# Patient Record
Sex: Male | Born: 1955 | Race: White | Hispanic: No | Marital: Single | Smoking: Never smoker
Health system: Southern US, Community
[De-identification: ages and names within clinical notes are randomized; demographics above are authoritative.]

## PROBLEM LIST (undated history)

## (undated) DIAGNOSIS — F419 Anxiety disorder, unspecified: Secondary | ICD-10-CM

## (undated) DIAGNOSIS — G309 Alzheimer's disease, unspecified: Secondary | ICD-10-CM

## (undated) DIAGNOSIS — G20 Parkinson's disease: Secondary | ICD-10-CM

## (undated) DIAGNOSIS — G20A1 Parkinson's disease without dyskinesia, without mention of fluctuations: Secondary | ICD-10-CM

## (undated) DIAGNOSIS — I10 Essential (primary) hypertension: Secondary | ICD-10-CM

## (undated) DIAGNOSIS — F329 Major depressive disorder, single episode, unspecified: Secondary | ICD-10-CM

## (undated) DIAGNOSIS — F32A Depression, unspecified: Secondary | ICD-10-CM

## (undated) DIAGNOSIS — E785 Hyperlipidemia, unspecified: Secondary | ICD-10-CM

## (undated) DIAGNOSIS — G709 Myoneural disorder, unspecified: Secondary | ICD-10-CM

## (undated) DIAGNOSIS — F0280 Dementia in other diseases classified elsewhere without behavioral disturbance: Secondary | ICD-10-CM

## (undated) HISTORY — DX: Essential (primary) hypertension: I10

## (undated) HISTORY — DX: Anxiety disorder, unspecified: F41.9

## (undated) HISTORY — DX: Hyperlipidemia, unspecified: E78.5

## (undated) HISTORY — DX: Myoneural disorder, unspecified: G70.9

## (undated) HISTORY — PX: CERVICAL FUSION: SHX112

---

## 2012-08-23 ENCOUNTER — Encounter (HOSPITAL_COMMUNITY): Payer: Self-pay | Admitting: *Deleted

## 2012-08-23 ENCOUNTER — Emergency Department (HOSPITAL_COMMUNITY): Payer: Non-veteran care

## 2012-08-23 ENCOUNTER — Emergency Department (HOSPITAL_COMMUNITY)
Admission: EM | Admit: 2012-08-23 | Discharge: 2012-08-23 | Disposition: A | Discharge status: Home / Self Care | Payer: Non-veteran care | Attending: Emergency Medicine | Admitting: Emergency Medicine

## 2012-08-23 DIAGNOSIS — G20A1 Parkinson's disease without dyskinesia, without mention of fluctuations: Secondary | ICD-10-CM

## 2012-08-23 DIAGNOSIS — G20 Parkinson's disease: Secondary | ICD-10-CM

## 2012-08-23 DIAGNOSIS — Z79899 Other long term (current) drug therapy: Secondary | ICD-10-CM | POA: Insufficient documentation

## 2012-08-23 HISTORY — DX: Parkinson's disease: G20

## 2012-08-23 HISTORY — DX: Parkinson's disease without dyskinesia, without mention of fluctuations: G20.A1

## 2012-08-23 LAB — CBC
HCT: 40.1 % (ref 39.0–52.0)
MCHC: 35.2 g/dL (ref 30.0–36.0)
Platelets: 154 10*3/uL (ref 150–400)
RDW: 12 % (ref 11.5–15.5)
WBC: 5.8 10*3/uL (ref 4.0–10.5)

## 2012-08-23 LAB — BASIC METABOLIC PANEL
BUN: 19 mg/dL (ref 6–23)
Creatinine, Ser: 0.69 mg/dL (ref 0.50–1.35)
GFR calc Af Amer: >90 mL/min (ref >90)
GFR calc non Af Amer: >90 mL/min (ref >90)
Potassium: 3.4 mEq/L — ABNORMAL LOW (ref 3.5–5.1)

## 2012-08-23 LAB — MAGNESIUM: Magnesium: 2 mg/dL (ref 1.5–2.5)

## 2012-08-23 MED ORDER — HYDROMORPHONE HCL PF 1 MG/ML IJ SOLN
0.5000 mg | Freq: Once | INTRAMUSCULAR | Status: AC
  Administered 2012-08-23: 0.5 mg via INTRAVENOUS
  Filled 2012-08-23: qty 1

## 2012-08-23 MED ORDER — CARBIDOPA-LEVODOPA 25-100 MG PO TABS
0.5000 | ORAL_TABLET | Freq: Once | ORAL | Status: AC
  Administered 2012-08-23: 0.5 via ORAL
  Filled 2012-08-23: qty 0.5

## 2012-08-23 MED ORDER — ENTACAPONE 200 MG PO TABS
200.0000 mg | ORAL_TABLET | Freq: Once | ORAL | Status: AC
  Administered 2012-08-23: 200 mg via ORAL
  Filled 2012-08-23: qty 1

## 2012-08-23 MED ORDER — LACTATED RINGERS IV BOLUS (SEPSIS)
1000.0000 mL | Freq: Once | INTRAVENOUS | Status: AC
  Administered 2012-08-23: 1000 mL via INTRAVENOUS

## 2012-08-23 MED ORDER — CARBIDOPA-LEVODOPA 25-100 MG PO TABS
1.5000 | ORAL_TABLET | Freq: Once | ORAL | Status: AC
  Administered 2012-08-23: 1.5 via ORAL
  Filled 2012-08-23: qty 1.5

## 2012-08-23 MED ORDER — HYDROMORPHONE HCL PF 1 MG/ML IJ SOLN: 1.0000 mg | Freq: Once | INTRAMUSCULAR | Status: DC

## 2012-08-23 MED ORDER — CARBIDOPA-LEVODOPA 25-100 MG PO TABS
1.0000 | ORAL_TABLET | Freq: Once | ORAL | Status: AC
  Administered 2012-08-23: 1 via ORAL
  Filled 2012-08-23: qty 1

## 2012-08-23 MED ORDER — OXYCODONE-ACETAMINOPHEN 5-325 MG PO TABS
2.0000 | ORAL_TABLET | Freq: Once | ORAL | Status: AC
  Administered 2012-08-23: 2 via ORAL
  Filled 2012-08-23: qty 2

## 2012-08-23 MED ORDER — HYDROMORPHONE HCL PF 1 MG/ML IJ SOLN
1.0000 mg | Freq: Once | INTRAMUSCULAR | Status: AC
  Administered 2012-08-23: 1 mg via INTRAVENOUS
  Filled 2012-08-23: qty 1

## 2012-08-23 NOTE — Consult Note (Signed)
Reason for Consult:Parkinsons disease Referring Physician: Rulon Abide, J  CC: Freezing  History is obtained from: PAtient, son  HPI: Larry Daniel is a 56 y.o. male who recently moved from [place] due to progression of his Parkinson's disease. Over the past few months, he has had increasing freezing and pain just before his next dose. He currently takes his sinemet as follows: Take 1 & 1/2 tablets at 6 am.  Take 1 tablet at 12 pm.  Take 1/2 tablet at 2 pm.  Take 1 tablet at 6 pm.  Take 1 tablet at 10 pm.  He takes his entacapone: Take 1 tablet at 6 am.  Take 1 tablet at 10 am.  Take 1 tablet at 2 pm.  Take 1 tablet at 6 pm.  Take 1 tablet at 10 pm.  He has had continued progression, but has not changed his dosing in over a month. He has gotten to the point that he is not able to care for himself during the day and his son works. He is in significant pain and falling due to the freezing. He is getting stuck all throughout the day.   He is having hallucinations of people that he is aware are hallucinations and he is not bothered by them.    ROS: A 14 point ROS was performed and is negative except as noted in the HPI.  Past Medical History  Diagnosis Date  . Parkinson disease     Family History: No history of parkinsons  Social History: Tob: none  Exam: Current vital signs: BP 123/70  Pulse 72  Resp 20  Ht 6\' 5"  (1.956 m)  Wt 104.327 kg (230 lb)  BMI 27.27 kg/m2  SpO2 97% Vital signs in last 24 hours: Pulse Rate:  [72] 72  (11/04 1344) Resp:  [20] 20  (11/04 1344) BP: (123)/(70) 123/70 mmHg (11/04 1344) SpO2:  [97 %] 97 % (11/04 1344) Weight:  [104.327 kg (230 lb)] 104.327 kg (230 lb) (11/04 1344)  General: In bed, appears mildly uncomfortable.  CV: RRR Mental Status: Patient is awake, alert, oriented to person, place, month, year, and situation. Immediate and remote memory are intact. Cranial Nerves: II: Visual Fields are full. Pupils are equal, round, and  reactive to light.  Discs are difficult to visualize. III,IV, VI: EOMI without ptosis or diploplia.  V: Facial sensation is symmetric to temperature VII: Facial movement is symmetric, but with marked mask facies.  VIII: hearing is intact to voice X: Uvula elevates symmetrically XI: Shoulder shrug is symmetric. XII: tongue is midline without atrophy or fasciculations.  Motor: Tone is increased with cogwheeling throughout. Bulk is normal. 5/5 strength was present in all four extremities, however he did have trouble initiating movements.  Sensory: Sensation is symmetric to light touch and temperature in the arms and legs. Deep Tendon Reflexes: 2+ and symmetric in the biceps and patellae.  Cerebellar: Marked resting tremor bilaterally Gait: Did not assess alone secondary to patient safety concerns.   I have reviewed labs in epic and the results pertinent to this consultation are: CBC/BMP unremarkable  I have reviewed the images obtained:CT head - No acute changes  Impression: 56 yo M with severe symptoms of parkinsons disease who is not coping at home. I am not familiar with the [facility] and would be hesitant to add medications which he might not be able to continue as an outpatient. At this time, I feel that he needs more dopamine. A presurgical workup may be helpful as well,  but this is not something that can be done with this hospiatliazation.   Recommendations: 1) If possible to transfer patient to [place] as both he and his son wish, this would be reasonable as it is where he is to establish care.   2) If discharged with no plan to go to [place] or if admitted here I would schedule his sinemet as follows: 7 am 2 tablets 11 am 1.5 tablets  2 pm  1 tablet 5 pm 1.5 tablets 8 pm 1 tablet 11pm 1 tablet prn if having dystonia preventing sleep.  I would give a dose of entacapone with each dose of sinemet His hallucinations may get worse, but if they are not bothersome to him, then I would  not adjust his medication solely based on this.   3) He would benefit whether inpatient or out from physical therapy with the goal of training in ways to unfreeze.   Ritta Slot, MD [facility] [phone]  If 7pm- 7am, please page neurology on call at [phone].

## 2012-08-23 NOTE — ED Notes (Signed)
Per ems: pt from home, hx of Parkinson's Disease. Caregiver with patient this morning, pt in more pain than usual, states extremities felt "locked up". Muscles still stiff,could not get pain under control. States tremors r/t parkinson's disease are worse as well. Pt a+ox4. Family en route

## 2012-08-23 NOTE — ED Provider Notes (Signed)
History     CSN: [id number]  Arrival date & time 08/23/12  1332   First MD Initiated Contact with Patient 08/23/12 1439      Chief Complaint  Patient presents with  . Tremors    (Consider location/radiation/quality/duration/timing/severity/associated sxs/prior treatment) HPI Larry Daniel is a 56 y.o. male w/ PMH of Parkinson's recently moved in with his son for social reasons.  Parkinson's has been worsening and he's been getting stuck in bed and on the toilet.  UE pain in muscles started to day, also in neck. 10/10, sharp, stabbing, worse on passive flexion and extension - but this relieves the pain after awhile with massage, has also had chills and cough x2 weeks, non-productive.    Past Medical History  Diagnosis Date  . Parkinson disease     History reviewed. No pertinent past surgical history.  No family history on file.  History  Substance Use Topics  . Smoking status: Never Smoker   . Smokeless tobacco: Not on file  . Alcohol Use: No      Review of Systems At least 10pt or greater review of systems completed and are negative except where specified in the HPI.  Allergies  Review of patient's allergies indicates no known allergies.  Home Medications   Current Outpatient Rx  Name  Route  Sig  Dispense  Refill  . CARBIDOPA-LEVODOPA 25-100 MG PO TABS   Oral   Take 1 tablet by mouth.         [name] CITALOPRAM HYDROBROMIDE 20 MG PO TABS   Oral   Take 10 mg by mouth at bedtime. Take 1/2 tablet at bedtime.         . CYANOCOBALAMIN 100 MCG PO TABS   Oral   Take 100 mcg by mouth every 4 (four) hours.         [name] ENTACAPONE 200 MG PO TABS   Oral   Take 200 mg by mouth.         . OMEGA-3 FATTY ACIDS 1000 MG PO CAPS   Oral   Take 2 g by mouth daily.         [name] FOLIC ACID 1 MG PO TABS   Oral   Take 1 mg by mouth daily.         . ADULT MULTIVITAMIN W/MINERALS CH   Oral   Take 1 tablet by mouth daily.         [name] PRESCRIPTION MEDICATION     "Donaz."           BP 123/70  Pulse 72  Resp 20  Ht 6\' 5"  (1.956 m)  Wt 230 lb (104.327 kg)  BMI 27.27 kg/m2  SpO2 97%  Physical Exam  Nursing notes reviewed.  Electronic medical record reviewed. VITAL SIGNS:   Filed Vitals:   08/23/12 1344 08/23/12 2220  BP: 123/70 109/66  Pulse: 72 72  Temp:  97.9 F (36.6 C)  TempSrc:  Oral  Resp: 20 18  Height: 6\' 5"  (1.956 m)   Weight: 230 lb (104.327 kg)   SpO2: 97% 97%   CONSTITUTIONAL: Awake, oriented, appears non-toxic HENT: Atraumatic, normocephalic, oral mucosa pink and moist, airway patent. Nares patent without drainage. External ears normal. EYES: Conjunctiva clear, EOMI, PERRLA NECK: Trachea midline, non-tender, supple CARDIOVASCULAR: Normal heart rate, Normal rhythm, No murmurs, rubs, gallops PULMONARY/CHEST: Clear to auscultation, no rhonchi, wheezes, or rales. Symmetrical breath sounds. Non-tender. ABDOMINAL: Non-distended, soft, non-tender - no rebound or guarding.  BS normal. NEUROLOGIC: Rigid throughout.  Pain to passive muscle stretching of UE.  No pain in LE.  Severe bradykinesia EXTREMITIES: No clubbing, cyanosis, or edema SKIN: Warm, Dry, No erythema, No rash  ED Course  Procedures (including critical care time)  Labs Reviewed  BASIC METABOLIC PANEL - Abnormal; Notable for the following:    Potassium 3.4 (*)     All other components within normal limits  CBC  MAGNESIUM  LAB REPORT - SCANNED   Dg Chest 2 View  08/23/2012  *RADIOLOGY REPORT*  Clinical Data: Tremors, shortness of breath  CHEST - 2 VIEW  Comparison: None  Findings: Normal heart size, mediastinal contours, and pulmonary vascularity. Lungs clear. No pleural effusion or pneumothorax. Prior cervical spine fusion. Endplate spur formation mid to lower thoracic spine.  IMPRESSION: No acute abnormalities.   Original Report Authenticated By: Ulyses Southward, M.D.    Ct Head Wo Contrast  08/23/2012  *RADIOLOGY REPORT*  Clinical Data: History of Parkinson's  disease.  Increased tremors.  CT HEAD WITHOUT CONTRAST  Technique:  Contiguous axial images were obtained from the base of the skull through the vertex without contrast.  Comparison: None.  Findings: There is no midline shift, hydrocephalus, or mass effect. No acute hemorrhage or acute transcortical infarct is identified. There is mild chronic diffuse atrophy.  The bony calvarium is intact.  There is minimal mucoperiosteal thickening of bilateral ethmoid sinuses.  IMPRESSION: No focal acute intracranial abnormality identified.  Mild chronic diffuse atrophy.   Original Report Authenticated By: Sherian Rein, M.D.      1. Parkinson's disease (tremor, stiffness, slow motion, unstable posture)       MDM  Larry Daniel is a 56 y.o. male is a [facility] patient recently moved to the area with neurology follow up on Thursday - son thinks they can't wait with pain and worsening symptoms.  They did not want to go to [place] incase whe wasn't admitted.  D/W Dr. Amada Jupiter who thinks pt does need admission for medication optimization.  Labs and imaging unremarkable.  Sinamet given in ER.  Extensive amount of time spent trying to coordinate care with [facility] - extraordinarily unhelpful.  Pt and son elected to drive to [facility] for evaluation and admission.  Dr. Amada Jupiter was concerned because of a difference in formularies between civilian and [employer] system, an admission at [facility] would not be in the patient's best interest.  Pain medicine administered and pt DC d to f/u immediately at [facility].          Jones Skene, MD 08/25/12 0865

## 2013-01-04 ENCOUNTER — Inpatient Hospital Stay (HOSPITAL_COMMUNITY)
Admission: EM | Admit: 2013-01-04 | Discharge: 2013-01-10 | DRG: 470 | Disposition: A | Discharge status: Skilled Nursing Facility | Payer: Medicare Other | Attending: Family Medicine | Admitting: Family Medicine

## 2013-01-04 ENCOUNTER — Inpatient Hospital Stay (HOSPITAL_COMMUNITY): Payer: Medicare Other

## 2013-01-04 ENCOUNTER — Encounter (HOSPITAL_COMMUNITY): Payer: Self-pay | Admitting: Certified Registered"

## 2013-01-04 ENCOUNTER — Inpatient Hospital Stay (HOSPITAL_COMMUNITY): Payer: Medicare Other | Admitting: Certified Registered"

## 2013-01-04 ENCOUNTER — Emergency Department (HOSPITAL_COMMUNITY): Payer: Medicare Other

## 2013-01-04 ENCOUNTER — Encounter (HOSPITAL_COMMUNITY): Payer: Self-pay | Admitting: Emergency Medicine

## 2013-01-04 ENCOUNTER — Encounter (HOSPITAL_COMMUNITY)
Admission: EM | Disposition: A | Discharge status: Skilled Nursing Facility | Payer: Self-pay | Source: Home / Self Care | Attending: Family Medicine

## 2013-01-04 ENCOUNTER — Other Ambulatory Visit: Payer: Self-pay

## 2013-01-04 DIAGNOSIS — Z79899 Other long term (current) drug therapy: Secondary | ICD-10-CM

## 2013-01-04 DIAGNOSIS — S72001A Fracture of unspecified part of neck of right femur, initial encounter for closed fracture: Secondary | ICD-10-CM

## 2013-01-04 DIAGNOSIS — G20A1 Parkinson's disease without dyskinesia, without mention of fluctuations: Secondary | ICD-10-CM | POA: Diagnosis present

## 2013-01-04 DIAGNOSIS — G20 Parkinson's disease: Secondary | ICD-10-CM

## 2013-01-04 DIAGNOSIS — Y92009 Unspecified place in unspecified non-institutional (private) residence as the place of occurrence of the external cause: Secondary | ICD-10-CM

## 2013-01-04 DIAGNOSIS — S72009A Fracture of unspecified part of neck of unspecified femur, initial encounter for closed fracture: Principal | ICD-10-CM

## 2013-01-04 DIAGNOSIS — W010XXA Fall on same level from slipping, tripping and stumbling without subsequent striking against object, initial encounter: Secondary | ICD-10-CM | POA: Diagnosis present

## 2013-01-04 DIAGNOSIS — E871 Hypo-osmolality and hyponatremia: Secondary | ICD-10-CM | POA: Diagnosis present

## 2013-01-04 HISTORY — PX: HIP ARTHROPLASTY: SHX981

## 2013-01-04 LAB — BASIC METABOLIC PANEL
BUN: 16 mg/dL (ref 6–23)
Creatinine, Ser: 0.77 mg/dL (ref 0.50–1.35)
GFR calc Af Amer: >90 mL/min (ref >90)
GFR calc non Af Amer: >90 mL/min (ref >90)
Potassium: 3.8 mEq/L (ref 3.5–5.1)

## 2013-01-04 LAB — CBC WITH DIFFERENTIAL/PLATELET
Basophils Absolute: 0 10*3/uL (ref 0.0–0.1)
Basophils Relative: 0 % (ref 0–1)
Lymphocytes Relative: 8 % — ABNORMAL LOW (ref 12–46)
MCHC: 35 g/dL (ref 30.0–36.0)
Neutro Abs: 10.6 10*3/uL — ABNORMAL HIGH (ref 1.7–7.7)
Neutrophils Relative %: 83 % — ABNORMAL HIGH (ref 43–77)
RDW: 11.9 % (ref 11.5–15.5)
WBC: 12.8 10*3/uL — ABNORMAL HIGH (ref 4.0–10.5)

## 2013-01-04 LAB — TYPE AND SCREEN: ABO/RH(D): O POS

## 2013-01-04 LAB — SURGICAL PCR SCREEN: Staphylococcus aureus: NEGATIVE

## 2013-01-04 LAB — ABO/RH: ABO/RH(D): O POS

## 2013-01-04 SURGERY — HEMIARTHROPLASTY, HIP, DIRECT ANTERIOR APPROACH, FOR FRACTURE
Anesthesia: General | Site: Hip | Laterality: Right | Wound class: Clean

## 2013-01-04 MED ORDER — CEFAZOLIN SODIUM-DEXTROSE 2-3 GM-% IV SOLR
2.0000 g | INTRAVENOUS | Status: AC
  Administered 2013-01-04: 2 g via INTRAVENOUS
  Filled 2013-01-04: qty 50

## 2013-01-04 MED ORDER — ENOXAPARIN SODIUM 40 MG/0.4ML ~~LOC~~ SOLN: 40.0000 mg | SUBCUTANEOUS | Status: DC

## 2013-01-04 MED ORDER — ONDANSETRON HCL 4 MG/2ML IJ SOLN
INTRAMUSCULAR | Status: DC | PRN
  Administered 2013-01-04: 2 mg via INTRAVENOUS

## 2013-01-04 MED ORDER — CISATRACURIUM BESYLATE (PF) 10 MG/5ML IV SOLN
INTRAVENOUS | Status: DC | PRN
  Administered 2013-01-04: 10 mg via INTRAVENOUS

## 2013-01-04 MED ORDER — ACETAMINOPHEN 650 MG RE SUPP: 650.0000 mg | Freq: Four times a day (QID) | RECTAL | Status: DC | PRN

## 2013-01-04 MED ORDER — EPHEDRINE SULFATE 50 MG/ML IJ SOLN
INTRAMUSCULAR | Status: DC | PRN
  Administered 2013-01-04: 10 mg via INTRAVENOUS

## 2013-01-04 MED ORDER — FENTANYL CITRATE 0.05 MG/ML IJ SOLN
INTRAMUSCULAR | Status: DC | PRN
  Administered 2013-01-04: 50 ug via INTRAVENOUS
  Administered 2013-01-04: 100 ug via INTRAVENOUS
  Administered 2013-01-04: 50 ug via INTRAVENOUS
  Administered 2013-01-04 (×2): 25 ug via INTRAVENOUS

## 2013-01-04 MED ORDER — LIDOCAINE HCL (CARDIAC) 20 MG/ML IV SOLN
INTRAVENOUS | Status: DC | PRN
  Administered 2013-01-04: 30 mg via INTRAVENOUS

## 2013-01-04 MED ORDER — SODIUM CHLORIDE 0.9 % IV SOLN: INTRAVENOUS | Status: DC

## 2013-01-04 MED ORDER — PHENOL 1.4 % MT LIQD
1.0000 | OROMUCOSAL | Status: DC | PRN
  Filled 2013-01-04: qty 177

## 2013-01-04 MED ORDER — ACETAMINOPHEN 10 MG/ML IV SOLN
INTRAVENOUS | Status: DC | PRN
  Administered 2013-01-04: 1000 mg via INTRAVENOUS

## 2013-01-04 MED ORDER — SODIUM CHLORIDE 0.9 % IV BOLUS (SEPSIS)
500.0000 mL | Freq: Once | INTRAVENOUS | Status: AC
  Administered 2013-01-04: 500 mL via INTRAVENOUS

## 2013-01-04 MED ORDER — ACETAMINOPHEN 325 MG PO TABS
650.0000 mg | ORAL_TABLET | Freq: Four times a day (QID) | ORAL | Status: DC | PRN
  Administered 2013-01-06 – 2013-01-09 (×2): 650 mg via ORAL
  Filled 2013-01-04 (×2): qty 2

## 2013-01-04 MED ORDER — SODIUM CHLORIDE 0.9 % IV SOLN
INTRAVENOUS | Status: DC
  Administered 2013-01-04 – 2013-01-08 (×6): via INTRAVENOUS

## 2013-01-04 MED ORDER — ENOXAPARIN SODIUM 40 MG/0.4ML ~~LOC~~ SOLN
40.0000 mg | SUBCUTANEOUS | Status: DC
  Filled 2013-01-04: qty 0.4

## 2013-01-04 MED ORDER — ACETAMINOPHEN 10 MG/ML IV SOLN
INTRAVENOUS | Status: AC
  Filled 2013-01-04: qty 100

## 2013-01-04 MED ORDER — HYDROMORPHONE HCL PF 1 MG/ML IJ SOLN
INTRAMUSCULAR | Status: DC | PRN
  Administered 2013-01-04: 1.5 mg via INTRAVENOUS
  Administered 2013-01-04: 0.5 mg via INTRAVENOUS

## 2013-01-04 MED ORDER — DOCUSATE SODIUM 100 MG PO CAPS
100.0000 mg | ORAL_CAPSULE | Freq: Two times a day (BID) | ORAL | Status: DC
  Administered 2013-01-05 – 2013-01-09 (×10): 100 mg via ORAL
  Filled 2013-01-04 (×12): qty 1

## 2013-01-04 MED ORDER — CHLORHEXIDINE GLUCONATE 4 % EX LIQD
60.0000 mL | Freq: Once | CUTANEOUS | Status: AC
  Administered 2013-01-04: 4 via TOPICAL
  Filled 2013-01-04: qty 60

## 2013-01-04 MED ORDER — MIDAZOLAM HCL 2 MG/2ML IJ SOLN
INTRAMUSCULAR | Status: AC
  Filled 2013-01-04: qty 2

## 2013-01-04 MED ORDER — LACTATED RINGERS IV SOLN
INTRAVENOUS | Status: DC | PRN
  Administered 2013-01-04 (×3): via INTRAVENOUS

## 2013-01-04 MED ORDER — MENTHOL 3 MG MT LOZG
1.0000 | LOZENGE | OROMUCOSAL | Status: DC | PRN
  Filled 2013-01-04: qty 9

## 2013-01-04 MED ORDER — GLYCOPYRROLATE 0.2 MG/ML IJ SOLN
INTRAMUSCULAR | Status: DC | PRN
  Administered 2013-01-04: 0.4 mg via INTRAVENOUS

## 2013-01-04 MED ORDER — MORPHINE SULFATE 2 MG/ML IJ SOLN
2.0000 mg | INTRAMUSCULAR | Status: DC | PRN
  Administered 2013-01-04 – 2013-01-07 (×8): 2 mg via INTRAVENOUS
  Filled 2013-01-04 (×9): qty 1

## 2013-01-04 MED ORDER — METOCLOPRAMIDE HCL 10 MG PO TABS: 5.0000 mg | ORAL_TABLET | Freq: Three times a day (TID) | ORAL | Status: DC | PRN

## 2013-01-04 MED ORDER — ONDANSETRON HCL 4 MG PO TABS
4.0000 mg | ORAL_TABLET | Freq: Four times a day (QID) | ORAL | Status: DC | PRN
  Administered 2013-01-09: 4 mg via ORAL
  Filled 2013-01-04: qty 1

## 2013-01-04 MED ORDER — HYDROMORPHONE HCL PF 1 MG/ML IJ SOLN
0.2500 mg | INTRAMUSCULAR | Status: DC | PRN
  Administered 2013-01-04 – 2013-01-05 (×3): 0.5 mg via INTRAVENOUS
  Filled 2013-01-04 (×2): qty 1

## 2013-01-04 MED ORDER — ENOXAPARIN SODIUM 40 MG/0.4ML ~~LOC~~ SOLN
40.0000 mg | SUBCUTANEOUS | Status: DC
  Administered 2013-01-05 – 2013-01-10 (×6): 40 mg via SUBCUTANEOUS
  Filled 2013-01-04 (×8): qty 0.4

## 2013-01-04 MED ORDER — MORPHINE SULFATE 2 MG/ML IJ SOLN: 0.5000 mg | INTRAMUSCULAR | Status: DC | PRN

## 2013-01-04 MED ORDER — ONDANSETRON HCL 4 MG/2ML IJ SOLN: 4.0000 mg | Freq: Four times a day (QID) | INTRAMUSCULAR | Status: DC | PRN

## 2013-01-04 MED ORDER — LACTATED RINGERS IV SOLN: INTRAVENOUS | Status: DC

## 2013-01-04 MED ORDER — FERROUS SULFATE 325 (65 FE) MG PO TABS
325.0000 mg | ORAL_TABLET | Freq: Three times a day (TID) | ORAL | Status: DC
  Administered 2013-01-05 – 2013-01-10 (×15): 325 mg via ORAL
  Filled 2013-01-04 (×19): qty 1

## 2013-01-04 MED ORDER — CITALOPRAM HYDROBROMIDE 10 MG PO TABS
10.0000 mg | ORAL_TABLET | Freq: Every day | ORAL | Status: DC
  Administered 2013-01-04 – 2013-01-09 (×6): 10 mg via ORAL
  Filled 2013-01-04 (×7): qty 1

## 2013-01-04 MED ORDER — BISACODYL 10 MG RE SUPP: 10.0000 mg | Freq: Every day | RECTAL | Status: DC | PRN

## 2013-01-04 MED ORDER — MIDAZOLAM HCL 5 MG/5ML IJ SOLN
INTRAMUSCULAR | Status: DC | PRN
  Administered 2013-01-04 (×3): 1 mg via INTRAVENOUS

## 2013-01-04 MED ORDER — HYDROCODONE-ACETAMINOPHEN 5-325 MG PO TABS
1.0000 | ORAL_TABLET | Freq: Four times a day (QID) | ORAL | Status: DC | PRN
  Administered 2013-01-05: 1 via ORAL
  Administered 2013-01-05 – 2013-01-10 (×6): 2 via ORAL
  Filled 2013-01-04 (×3): qty 2
  Filled 2013-01-04: qty 1
  Filled 2013-01-04 (×4): qty 2

## 2013-01-04 MED ORDER — MIDAZOLAM HCL 2 MG/2ML IJ SOLN: 2.0000 mg | INTRAMUSCULAR | Status: DC | PRN

## 2013-01-04 MED ORDER — PROPOFOL 10 MG/ML IV EMUL
INTRAVENOUS | Status: DC | PRN
  Administered 2013-01-04: 200 mg via INTRAVENOUS

## 2013-01-04 MED ORDER — 0.9 % SODIUM CHLORIDE (POUR BTL) OPTIME
TOPICAL | Status: DC | PRN
  Administered 2013-01-04: 1000 mL

## 2013-01-04 MED ORDER — ENTACAPONE 200 MG PO TABS
200.0000 mg | ORAL_TABLET | Freq: Every day | ORAL | Status: DC
  Administered 2013-01-04 – 2013-01-10 (×25): 200 mg via ORAL
  Filled 2013-01-04 (×33): qty 1

## 2013-01-04 MED ORDER — CARBIDOPA-LEVODOPA 25-100 MG PO TABS: 0.5000 | ORAL_TABLET | ORAL | Status: DC

## 2013-01-04 MED ORDER — MIDAZOLAM HCL 2 MG/2ML IJ SOLN
2.0000 mg | Freq: Once | INTRAMUSCULAR | Status: AC
  Administered 2013-01-04: 2 mg via INTRAVENOUS

## 2013-01-04 MED ORDER — PHENYLEPHRINE HCL 10 MG/ML IJ SOLN
INTRAMUSCULAR | Status: DC | PRN
  Administered 2013-01-04 (×2): 80 ug via INTRAVENOUS
  Administered 2013-01-04: 40 ug via INTRAVENOUS
  Administered 2013-01-04 (×2): 80 ug via INTRAVENOUS

## 2013-01-04 MED ORDER — DEXTROSE 5 % IV SOLN: 3.0000 g | INTRAVENOUS | Status: DC

## 2013-01-04 MED ORDER — CARBIDOPA-LEVODOPA 25-100 MG PO TABS
1.0000 | ORAL_TABLET | Freq: Two times a day (BID) | ORAL | Status: DC
  Administered 2013-01-05 – 2013-01-10 (×11): 1 via ORAL
  Filled 2013-01-04 (×16): qty 1

## 2013-01-04 MED ORDER — METOCLOPRAMIDE HCL 5 MG/ML IJ SOLN: 5.0000 mg | Freq: Three times a day (TID) | INTRAMUSCULAR | Status: DC | PRN

## 2013-01-04 MED ORDER — HYDROMORPHONE HCL PF 1 MG/ML IJ SOLN
INTRAMUSCULAR | Status: AC
  Filled 2013-01-04: qty 1

## 2013-01-04 MED ORDER — SODIUM CHLORIDE 0.9 % IV SOLN
INTRAVENOUS | Status: DC
  Administered 2013-01-04: 14:00:00 via INTRAVENOUS

## 2013-01-04 MED ORDER — CEFAZOLIN SODIUM-DEXTROSE 2-3 GM-% IV SOLR
2.0000 g | Freq: Three times a day (TID) | INTRAVENOUS | Status: AC
  Administered 2013-01-05 (×3): 2 g via INTRAVENOUS
  Filled 2013-01-04 (×3): qty 50

## 2013-01-04 MED ORDER — SUCCINYLCHOLINE CHLORIDE 20 MG/ML IJ SOLN
INTRAMUSCULAR | Status: DC | PRN
  Administered 2013-01-04: 150 mg via INTRAVENOUS

## 2013-01-04 MED ORDER — NEOSTIGMINE METHYLSULFATE 1 MG/ML IJ SOLN
INTRAMUSCULAR | Status: DC | PRN
  Administered 2013-01-04: 3 mg via INTRAVENOUS

## 2013-01-04 MED ORDER — CARBIDOPA-LEVODOPA 25-100 MG PO TABS
1.5000 | ORAL_TABLET | ORAL | Status: DC
  Administered 2013-01-04 – 2013-01-09 (×29): 1.5 via ORAL
  Filled 2013-01-04 (×39): qty 1.5

## 2013-01-04 MED ORDER — DONEPEZIL HCL 10 MG PO TABS
10.0000 mg | ORAL_TABLET | Freq: Every day | ORAL | Status: DC
  Administered 2013-01-04 – 2013-01-09 (×6): 10 mg via ORAL
  Filled 2013-01-04 (×7): qty 1

## 2013-01-04 SURGICAL SUPPLY — 38 items
BAG ZIPLOCK 12X15 (MISCELLANEOUS) ×2 IMPLANT
BLADE SAW SAG 73X25 THK (BLADE) ×1
BLADE SAW SGTL 73X25 THK (BLADE) ×1 IMPLANT
CLOTH BEACON ORANGE TIMEOUT ST (SAFETY) ×2 IMPLANT
DRAPE INCISE IOBAN 66X45 STRL (DRAPES) ×2 IMPLANT
DRAPE ORTHO SPLIT 77X108 STRL (DRAPES) ×2
DRAPE POUCH INSTRU U-SHP 10X18 (DRAPES) ×2 IMPLANT
DRAPE SURG ORHT 6 SPLT 77X108 (DRAPES) ×2 IMPLANT
DRAPE U-SHAPE 47X51 STRL (DRAPES) ×2 IMPLANT
DRSG EMULSION OIL 3X16 NADH (GAUZE/BANDAGES/DRESSINGS) ×2 IMPLANT
DRSG MEPILEX BORDER 4X4 (GAUZE/BANDAGES/DRESSINGS) ×2 IMPLANT
DRSG MEPILEX BORDER 4X8 (GAUZE/BANDAGES/DRESSINGS) ×2 IMPLANT
ELECT BLADE TIP CTD 4 INCH (ELECTRODE) ×2 IMPLANT
ELECT REM PT RETURN 9FT ADLT (ELECTROSURGICAL) ×2
ELECTRODE REM PT RTRN 9FT ADLT (ELECTROSURGICAL) ×1 IMPLANT
EVACUATOR 1/8 PVC DRAIN (DRAIN) IMPLANT
GLOVE ORTHO TXT STRL SZ7.5 (GLOVE) ×2 IMPLANT
GLOVE SURG ORTHO 8.5 STRL (GLOVE) ×2 IMPLANT
GOWN STRL NON-REIN LRG LVL3 (GOWN DISPOSABLE) ×4 IMPLANT
IMMOBILIZER KNEE 20 (SOFTGOODS)
IMMOBILIZER KNEE 20 THIGH 36 (SOFTGOODS) IMPLANT
MANIFOLD NEPTUNE II (INSTRUMENTS) ×2 IMPLANT
PACK TOTAL JOINT (CUSTOM PROCEDURE TRAY) ×2 IMPLANT
POSITIONER SURGICAL ARM (MISCELLANEOUS) ×2 IMPLANT
SPONGE GAUZE 4X4 12PLY (GAUZE/BANDAGES/DRESSINGS) IMPLANT
STAPLER VISISTAT 35W (STAPLE) IMPLANT
STRIP CLOSURE SKIN 1/2X4 (GAUZE/BANDAGES/DRESSINGS) ×6 IMPLANT
SUT ETHIBOND NAB CT1 #1 30IN (SUTURE) ×2 IMPLANT
SUT MNCRL AB 4-0 PS2 18 (SUTURE) ×2 IMPLANT
SUT VIC AB 0 CT1 27 (SUTURE) ×1
SUT VIC AB 0 CT1 27XBRD ANTBC (SUTURE) ×1 IMPLANT
SUT VIC AB 0 CT1 36 (SUTURE) ×2 IMPLANT
SUT VIC AB 1 CT1 36 (SUTURE) ×8 IMPLANT
SUT VIC AB 2-0 CT1 27 (SUTURE) ×3
SUT VIC AB 2-0 CT1 TAPERPNT 27 (SUTURE) ×3 IMPLANT
TOWEL OR 17X26 10 PK STRL BLUE (TOWEL DISPOSABLE) ×4 IMPLANT
TOWER CARTRIDGE SMART MIX (DISPOSABLE) ×2 IMPLANT
TRAY FOLEY CATH 14FRSI W/METER (CATHETERS) IMPLANT

## 2013-01-04 NOTE — ED Notes (Signed)
Son Gregary Signs [phone]

## 2013-01-04 NOTE — Brief Op Note (Signed)
01/04/2013  6:38 PM  PATIENT:  Larry Daniel  57 y.o. male  PRE-OPERATIVE DIAGNOSIS:  right hip fracture, displaced femoral neck  POST-OPERATIVE DIAGNOSIS:  right hip fracture, displaced femoral neck  PROCEDURE:  Procedure(s): ARTHROPLASTY BIPOLAR HIP (Right), DePuy Triloc Unipolar hip  SURGEON:  Surgeon(s) and Role:    * Verlee Rossetti, MD - Primary  PHYSICIAN ASSISTANT:   ASSISTANTS: Thea Gist, PA-C   ANESTHESIA:   general  EBL:  Total I/O In: 1800 [I.V.:1800] Out: 450 [Urine:150; Blood:300]  BLOOD ADMINISTERED:none  DRAINS: none   LOCAL MEDICATIONS USED:  NONE  SPECIMEN:  No Specimen  DISPOSITION OF SPECIMEN:  N/A  COUNTS:  YES  TOURNIQUET:  * No tourniquets in log *  DICTATION: .Other Dictation: Dictation Number [id number]  PLAN OF CARE: Admit to inpatient   PATIENT DISPOSITION:  PACU - hemodynamically stable.   Delay start of Pharmacological VTE agent (>24hrs) due to surgical blood loss or risk of bleeding: no

## 2013-01-04 NOTE — ED Notes (Signed)
[id number]<YQ> Expected date:<BR> Expected time:<BR> Means of arrival:<BR> Comments:<BR> Hip pain

## 2013-01-04 NOTE — Progress Notes (Signed)
En route to floor, became very agitated, combative, wanting to get up to bathroom. Foley in place. Help obtained and obtained. Given dilaudid and versed per order

## 2013-01-04 NOTE — Transfer of Care (Signed)
Immediate Anesthesia Transfer of Care Note  Patient: Larry Daniel  Procedure(s) Performed: Procedure(s): ARTHROPLASTY BIPOLAR HIP (Right)  Patient Location: PACU  Anesthesia Type:General  Level of Consciousness: awake , confused moving all extremities  Airway & Oxygen Therapy: Patient Spontanous Breathing and Patient connected to face mask oxygen  Post-op Assessment: Report given to PACU RN, Post -op Vital signs reviewed and stable and Patient moving all extremities X 4  Post vital signs: stable  Complications: No apparent anesthesia complications

## 2013-01-04 NOTE — ED Provider Notes (Addendum)
History     CSN: [id number]  Arrival date & time 01/04/13  5621   First MD Initiated Contact with Patient 01/04/13 0920      Chief Complaint  Patient presents with  . Fall  . right hip pain     (Consider location/radiation/quality/duration/timing/severity/associated sxs/prior treatment) HPI.... accidental fall this morning while playing basketball at his living facility. Complains of right lateral hip pain. No head or neck trauma. Palpation makes symptoms worse. Severity is moderate. Patient has progressive Parkinson's disease  Past Medical History  Diagnosis Date  . Parkinson disease     Past Surgical History  Procedure Laterality Date  . Cervical fusion      History reviewed. No pertinent family history.  History  Substance Use Topics  . Smoking status: Never Smoker   . Smokeless tobacco: Never Used  . Alcohol Use: No      Review of Systems  All other systems reviewed and are negative.    Allergies  Review of patient's allergies indicates no known allergies.  Home Medications   Current Outpatient Rx  Name  Route  Sig  Dispense  Refill  . acetaminophen (TYLENOL) 500 MG tablet   Oral   Take 500 mg by mouth every 6 (six) hours as needed for pain.         . carbidopa-levodopa (SINEMET IR) 25-100 MG per tablet   Oral   Take 0.5-1.5 tablets by mouth every 2 (two) hours. Take 2 tablets at 6 am. Take 1 tablet at 8 am. Take 1.5 tablets at 10 am. Take 1.5 tablets at 12 pm.  Take 1.5 tablets at 2 pm.  Take 1.5 tablets at 4 pm. Take 1.5 tablets at 6 pm. Take 1 tablet at 8pm. Take 1.5 tablets at 10 pm.         . CARBIDOPA-LEVODOPA PO   Oral   Take 1 tablet by mouth daily as needed ((50/100mg  tablet) as needed for "freezing muscles".).         [name] citalopram (CELEXA) 20 MG tablet   Oral   Take 10 mg by mouth at bedtime.          . cyanocobalamin 500 MCG tablet   Oral   Take 500 mcg by mouth daily.         [name] donepezil (ARICEPT) 10 MG tablet  Oral   Take 10 mg by mouth at bedtime.         . entacapone (COMTAN) 200 MG tablet   Oral   Take 200 mg by mouth 5 (five) times daily. Take 1 tablet at 6 am. Take 1 tablet at 10 am. Take 1 tablet at 2 pm.  Take 1 tablet at 6 pm.  Take 1 tablet at 10 pm.         . hydrocodone-ibuprofen (VICOPROFEN) 5-200 MG per tablet   Oral   Take 1 tablet by mouth every 6 (six) hours as needed for pain.           BP 124/66  Pulse 79  Temp(Src) 97.8 F (36.6 C) (Oral)  Resp 18  SpO2 98%  Physical Exam  Nursing note and vitals reviewed. Constitutional: He is oriented to person, place, and time. He appears well-developed and well-nourished.  Patient is pleasant and alert. Tremulous  HENT:  Head: Normocephalic and atraumatic.  Eyes: Conjunctivae and EOM are normal. Pupils are equal, round, and reactive to light.  Neck: Normal range of motion. Neck supple.  Cardiovascular: Normal rate, regular rhythm and  normal heart sounds.   Pulmonary/Chest: Effort normal and breath sounds normal.  Abdominal: Soft. Bowel sounds are normal.  Musculoskeletal:  Tender right lateral hip. Pain with range of motion   Neurological: He is alert and oriented to person, place, and time.  Skin: Skin is warm and dry.  Psychiatric: He has a normal mood and affect.    ED Course  Procedures (including critical care time)  Labs Reviewed  CBC WITH DIFFERENTIAL - Abnormal; Notable for the following:    WBC 12.8 (*)    Neutrophils Relative 83 (*)    Neutro Abs 10.6 (*)    Lymphocytes Relative 8 (*)    All other components within normal limits  BASIC METABOLIC PANEL  TYPE AND SCREEN   Dg Hip Complete Right  01/04/2013  *RADIOLOGY REPORT*  Clinical Data: Fall  RIGHT HIP - COMPLETE 2+ VIEW  Comparison: None.  Findings: There is a fracture through the mid right femoral neck with slight displacement and rotation of the femoral head.  There is also slight impaction of the neck into the lateral femoral head.  Generative changes at L4-5.  IMPRESSION: Minimally displaced right femoral neck fracture.   Original Report Authenticated By: Jolaine Click, M.D.      No diagnosis found.   Date: 01/04/2013  Rate: 93  Rhythm: normal sinus rhythm  QRS Axis: normal  Intervals: normal  ST/T Wave abnormalities: normal  Conduction Disutrbances: none  Narrative Interpretation: unremarkable     MDM  Discussed fracture with Alphonsa Overall PA and Dr. Ranell Patrick, orthopedics.  Also consult with hospitalist. Admit for repair.        Donnetta Hutching, MD 01/04/13 1346  Donnetta Hutching, MD 01/04/13 [phone]

## 2013-01-04 NOTE — H&P (Addendum)
[facility] History and Physical  Larry Daniel ZOX:[id number] DOB: 12/06/1955 DOA: 01/04/2013  Referring physician: Dr Adriana Simas PCP: Clance Boll, MD   Chief Complaint: Fall at home  HPI:  57 year old history of Parkinson disease who follows at the [place] presented to the ED with a fall at home after tripping on the carpet and landed on his right hip. X-ray of the hip in the ED showed a mildly displaced right femoral neck fracture. Patient denies any dizziness, headache, blurry vision, chest, palpitations, shortness of breath,, nausea, vomiting, syncope, bowel or urinary symptoms. Denies any fever or chills. At baseline he uses a cane to ambulate , lives independently and is quite capable of his ADLs  Orthopedic consult was called from the ED and recommended OR for right hip hemiarthroplasty. Recommended [facility] admission.   Review of Systems:  Constitutional: Denies fever, chills, diaphoresis, appetite change and fatigue.  HEENT: Denies photophobia, eye pain, redness, hearing loss, ear pain, congestion, sore throat, rhinorrhea, sneezing, mouth sores, trouble swallowing, neck pain, neck stiffness and tinnitus.   Respiratory: Denies SOB, DOE, cough, chest tightness,  and wheezing.   Cardiovascular: Denies chest pain, palpitations and leg swelling.  Gastrointestinal: Denies nausea, vomiting, abdominal pain, diarrhea, constipation, blood in stool and abdominal distention.  Genitourinary: Denies dysuria, urgency, frequency, hematuria, flank pain and difficulty urinating.  Musculoskeletal: Pain with limited range of motion over the right hip. Denies myalgias, back pain, joint swelling, arthralgias . Has some unsteady gait and uses a cane for this Parkinson's disease. Skin: Denies pallor, rash and wound.  Neurological: Denies dizziness, seizures, syncope, weakness, light-headedness, numbness and headaches.   Hematological: Denies adenopathy. Easy bruising, personal or family  bleeding history  Psychiatric/Behavioral: Denies suicidal ideation, mood changes, confusion, nervousness, sleep disturbance and agitation   Past Medical History  Diagnosis Date  . Parkinson disease    Past Surgical History  Procedure Laterality Date  . Cervical fusion     Social History:  reports that he has never smoked. He has never used smokeless tobacco. He reports that he does not drink alcohol or use illicit drugs.  No Known Allergies  History reviewed. No pertinent family history.  Prior to Admission medications   Medication Sig Start Date End Date Taking? Authorizing Provider  acetaminophen (TYLENOL) 500 MG tablet Take 500 mg by mouth every 6 (six) hours as needed for pain.   Yes Historical Provider, MD  carbidopa-levodopa (SINEMET IR) 25-100 MG per tablet Take 0.5-1.5 tablets by mouth every 2 (two) hours. Take 2 tablets at 6 am. Take 1 tablet at 8 am. Take 1.5 tablets at 10 am. Take 1.5 tablets at 12 pm.  Take 1.5 tablets at 2 pm.  Take 1.5 tablets at 4 pm. Take 1.5 tablets at 6 pm. Take 1 tablet at 8pm. Take 1.5 tablets at 10 pm.   Yes Historical Provider, MD  CARBIDOPA-LEVODOPA PO Take 1 tablet by mouth daily as needed ((50/100mg  tablet) as needed for "freezing muscles".).   Yes Historical Provider, MD  citalopram (CELEXA) 20 MG tablet Take 10 mg by mouth at bedtime.    Yes Historical Provider, MD  cyanocobalamin 500 MCG tablet Take 500 mcg by mouth daily.   Yes Historical Provider, MD  donepezil (ARICEPT) 10 MG tablet Take 10 mg by mouth at bedtime.   Yes Historical Provider, MD  entacapone (COMTAN) 200 MG tablet Take 200 mg by mouth 5 (five) times daily. Take 1 tablet at 6 am. Take 1 tablet at 10 am. Take  1 tablet at 2 pm.  Take 1 tablet at 6 pm.  Take 1 tablet at 10 pm.   Yes Historical Provider, MD  hydrocodone-ibuprofen (VICOPROFEN) 5-200 MG per tablet Take 1 tablet by mouth every 6 (six) hours as needed for pain.   Yes Historical Provider, MD    Physical  Exam:  Filed Vitals:   01/04/13 0918 01/04/13 0922  BP:  124/66  Pulse:  79  Temp:  97.8 F (36.6 C)  TempSrc:  Oral  Resp:  18  SpO2: 97% 98%    Constitutional: Vital signs reviewed.  Patient is a well-developed and well-nourished in no acute distress and cooperative with exam. Alert and oriented x3.  Head: Normocephalic and atraumatic Ear: TM normal bilaterally Mouth: no erythema or exudates, MMM Eyes: PERRL, EOMI, conjunctivae normal, No scleral icterus.  Neck: Supple, Trachea midline normal ROM, No JVD, mass, thyromegaly, or carotid bruit present.  Cardiovascular: RRR, S1 normal, S2 normal, no MRG, pulses symmetric and intact bilaterally Pulmonary/Chest: CTAB, no wheezes, rales, or rhonchi Abdominal: Soft. Non-tender, non-distended, bowel sounds are normal, no masses, organomegaly, or guarding present.  GU: no CVA tenderness Musculoskeletal: Right hip externally rotated with tenderness to palpation. No joint deformities, erythema, or stiffness, ROM full and no nontender Ext: no edema and no cyanosis, pulses palpable bilaterally (DP and PT) Hematology: no cervical, inginal, or axillary adenopathy.  Neurological: A&O x3, resting tremors of both hands Strenght is normal and symmetric bilaterally, cranial nerve II-XII are grossly intact, no focal motor deficit, sensory intact to light touch bilaterally.  Skin: Warm, dry and intact. No rash, cyanosis, or clubbing.  Psychiatric: Normal mood and affect. speech and behavior is normal. Judgment and thought content normal. Cognition and memory are normal.   Labs on Admission:  Basic Metabolic Panel:  Recent Labs Lab 01/04/13 1247  NA 137  K 3.8  CL 101  CO2 25  GLUCOSE 96  BUN 16  CREATININE 0.77  CALCIUM 9.3   Liver Function Tests: No results found for this basename: AST, ALT, ALKPHOS, BILITOT, PROT, ALBUMIN,  in the last 168 hours No results found for this basename: LIPASE, AMYLASE,  in the last 168 hours No results found  for this basename: AMMONIA,  in the last 168 hours CBC:  Recent Labs Lab 01/04/13 1247  WBC 12.8*  NEUTROABS 10.6*  HGB 15.3  HCT 43.7  MCV 90.9  PLT 166   Cardiac Enzymes: No results found for this basename: CKTOTAL, CKMB, CKMBINDEX, TROPONINI,  in the last 168 hours BNP: No components found with this basename: POCBNP,  CBG: No results found for this basename: GLUCAP,  in the last 168 hours  Radiological Exams on Admission: Dg Hip Complete Right  01/04/2013  *RADIOLOGY REPORT*  Clinical Data: Fall  RIGHT HIP - COMPLETE 2+ VIEW  Comparison: None.  Findings: There is a fracture through the mid right femoral neck with slight displacement and rotation of the femoral head.  There is also slight impaction of the neck into the lateral femoral head. Generative changes at L4-5.  IMPRESSION: Minimally displaced right femoral neck fracture.   Original Report Authenticated By: Jolaine Click, M.D.     EKG: Normal sinus rhythm at 94, no ST-T changes  Assessment/Plan Principal Problem:   Fracture of femoral neck, right Secondary to mechanical fall. Admit to MedSurg IV fluids. Pain control with IV morphine. -Patient be made n.p.o. for OR. Orthopedic consulted. Plan for right hemiarthroplasty. -Patient does not have any other underlying medical disease  except for chronic Parkinson disease and thus does not want any further cardiac or pulmonary evaluation. EKG done in the ED is unremarkable. Labs normal. There is no strong indication for perioperative  beta blocker as well. - Active Problems:   Parkinson disease -Resume home medications.  DVT prophylaxis Subcutaneous Lovenox   Code Status: Full code Family Communication: spoke with son  Disposition Plan: SNF upon  dfischarge  Alfred Eckley [facility] Pager [phone]  If 7PM-7AM, please contact night-coverage [email or web address] Password [facility] 01/04/2013, 2:13 PM   Total time spent : 70 minutes

## 2013-01-04 NOTE — Anesthesia Postprocedure Evaluation (Signed)
  Anesthesia Post-op Note  Patient: Larry Daniel  Procedure(s) Performed: Procedure(s) (LRB): ARTHROPLASTY BIPOLAR HIP (Right)  Patient Location: PACU  Anesthesia Type: General  Level of Consciousness: awake and alert   Airway and Oxygen Therapy: Patient Spontanous Breathing  Post-op Pain: mild  Post-op Assessment: Post-op Vital signs reviewed, Patient's Cardiovascular Status Stable, Respiratory Function Stable, Patent Airway and No signs of Nausea or vomiting  Last Vitals:  Filed Vitals:   01/04/13 2000  BP: 136/80  Pulse: 98  Temp: 36.4 C  Resp: 14    Post-op Vital Signs: stable   Complications: No apparent anesthesia complications

## 2013-01-04 NOTE — ED Notes (Signed)
Pt comes from [facility] playing basketball with beach ball in the lobby when pt fell when his foot got hung up on the carpet. Pt c/o right side hip pain, no deformity or shortening. Pt not on blood thinners.

## 2013-01-04 NOTE — Consult Note (Signed)
Reason for Consult:Broken RightHip Referring Physician: Donnetta Hutching, MD  Larry Daniel is an 57 y.o. male.  HPI: 57 yo male resident of [facility] who had a fall earlier today. Complained of immediate right hip/groin pain. Unable to stand and bear weight on the right LE after the fall.  Patient denied LOC or dizziness, and thinks that he may have tripped on some carpet. Denies neck or back pain.  Past Medical History  Diagnosis Date  . Parkinson disease     Past Surgical History  Procedure Laterality Date  . Cervical fusion      History reviewed. No pertinent family history.  Social History:  reports that he has never smoked. He has never used smokeless tobacco. He reports that he does not drink alcohol or use illicit drugs.  Allergies: No Known Allergies  Medications: I have reviewed the patient's current medications.  No results found for this or any previous visit (from the past 48 hour(s)).  Dg Hip Complete Right  01/04/2013  *RADIOLOGY REPORT*  Clinical Data: Fall  RIGHT HIP - COMPLETE 2+ VIEW  Comparison: None.  Findings: There is a fracture through the mid right femoral neck with slight displacement and rotation of the femoral head.  There is also slight impaction of the neck into the lateral femoral head. Generative changes at L4-5.  IMPRESSION: Minimally displaced right femoral neck fracture.   Original Report Authenticated By: Jolaine Click, M.D.     ROS Blood pressure 124/66, pulse 79, temperature 97.8 F (36.6 C), temperature source Oral, resp. rate 18, SpO2 98.00%. Physical Exam  Healthy appearing adult male with tremors consistent with Parkinson's disease. Bilateral shoulders and elbows and wrists with normal active ROM. Right LE, shortened and externally rotated. Left hip, knee and ankle ROM pain free.  Right LE pain with any ROM. NVI distally.   Assessment/Plan: Displaced femoral neck fracture in this patient with Parkinson's disease. Discussed with the  patient and his son that he will require surgery for this injury.  Recommend right hip hemi-arthroplasty to allow for immediate mobilization and early WB.  Son agrees with the plan and is traveling back from [place] to be here.  Larry Daniel,STEVEN R 01/04/2013, 12:41 PM

## 2013-01-04 NOTE — Anesthesia Preprocedure Evaluation (Addendum)
Anesthesia Evaluation  Patient identified by MRN, date of birth, ID band Patient awake    Reviewed: Allergy & Precautions, H&P , NPO status , Patient's Chart, lab work & pertinent test results  Airway Mallampati: II TM Distance: >3 FB Neck ROM: full    Dental  (+) Chipped and Dental Advisory Given Small chip left front upper tooth:   Pulmonary neg pulmonary ROS,  breath sounds clear to auscultation  Pulmonary exam normal       Cardiovascular + dysrhythmias Atrial Fibrillation Rhythm:regular Rate:Normal  ECG Atrial Flutter   Neuro/Psych Parkinson's disease.  Mild dementia negative psych ROS   GI/Hepatic negative GI ROS, Neg liver ROS,   Endo/Other  negative endocrine ROS  Renal/GU negative Renal ROS  negative genitourinary   Musculoskeletal   Abdominal   Peds  Hematology negative hematology ROS (+)   Anesthesia Other Findings   Reproductive/Obstetrics negative OB ROS                          Anesthesia Physical Anesthesia Plan  ASA: III  Anesthesia Plan: General   Post-op Pain Management:    Induction: Intravenous  Airway Management Planned: Oral ETT  Additional Equipment:   Intra-op Plan:   Post-operative Plan: Extubation in OR  Informed Consent: I have reviewed the patients History and Physical, chart, labs and discussed the procedure including the risks, benefits and alternatives for the proposed anesthesia with the patient or authorized representative who has indicated his/her understanding and acceptance.   Dental Advisory Given  Plan Discussed with: CRNA and Surgeon  Anesthesia Plan Comments:         Anesthesia Quick Evaluation

## 2013-01-04 NOTE — Progress Notes (Signed)
Pt confirms pcp is [place] [facility] providers Purvis Sheffield and Cathi Roan, neurologist (as listed on [facility] snf information sheet sent with pt)  EPIC updated

## 2013-01-04 NOTE — H&P (Signed)
Larry Daniel is an 57 y.o. male.    Chief Complaint: right hip pain  HPI: Pt is a 57 y.o. male complaining of right hip pain s/p fall today. Denies any other injuries. Diagnosed with a femur fracture and consents for a right hip hemi arthroplasty to decrease pain and increase function  PCP:  BRYANT III, [name], MD  D/C Plans: Home with HHPT vs SNF/Rehab  PMH: Past Medical History  Diagnosis Date  . Parkinson disease     PSH: Past Surgical History  Procedure Laterality Date  . Cervical fusion      Social History:  reports that he has never smoked. He has never used smokeless tobacco. He reports that he does not drink alcohol or use illicit drugs.  Allergies:  No Known Allergies  Medications: Current Facility-Administered Medications  Medication Dose Route Frequency Provider Last Rate Last Dose  . 0.9 %  sodium chloride infusion   Intravenous Continuous Donnetta Hutching, MD      . ceFAZolin (ANCEF) IVPB 2 g/50 mL premix  2 g Intravenous On Call Donnetta Hutching, MD      . chlorhexidine (HIBICLENS) 4 % liquid 4 application  60 mL Topical Once Verlee Rossetti, MD       Current Outpatient Prescriptions  Medication Sig Dispense Refill  . acetaminophen (TYLENOL) 500 MG tablet Take 500 mg by mouth every 6 (six) hours as needed for pain.      . carbidopa-levodopa (SINEMET IR) 25-100 MG per tablet Take 0.5-1.5 tablets by mouth every 2 (two) hours. Take 2 tablets at 6 am. Take 1 tablet at 8 am. Take 1.5 tablets at 10 am. Take 1.5 tablets at 12 pm.  Take 1.5 tablets at 2 pm.  Take 1.5 tablets at 4 pm. Take 1.5 tablets at 6 pm. Take 1 tablet at 8pm. Take 1.5 tablets at 10 pm.      . CARBIDOPA-LEVODOPA PO Take 1 tablet by mouth daily as needed ((50/100mg  tablet) as needed for "freezing muscles".).      [name] citalopram (CELEXA) 20 MG tablet Take 10 mg by mouth at bedtime.       . cyanocobalamin 500 MCG tablet Take 500 mcg by mouth daily.      [name] donepezil (ARICEPT) 10 MG tablet Take 10 mg by  mouth at bedtime.      . entacapone (COMTAN) 200 MG tablet Take 200 mg by mouth 5 (five) times daily. Take 1 tablet at 6 am. Take 1 tablet at 10 am. Take 1 tablet at 2 pm.  Take 1 tablet at 6 pm.  Take 1 tablet at 10 pm.      . hydrocodone-ibuprofen (VICOPROFEN) 5-200 MG per tablet Take 1 tablet by mouth every 6 (six) hours as needed for pain.        Results for orders placed during the hospital encounter of 01/04/13 (from the past 48 hour(s))  CBC WITH DIFFERENTIAL     Status: Abnormal   Collection Time    01/04/13 12:47 PM      Result Value Range   WBC 12.8 (*) 4.0 - 10.5 K/uL   RBC 4.81  4.22 - 5.81 MIL/uL   Hemoglobin 15.3  13.0 - 17.0 g/dL   HCT 16.1  09.6 - 04.5 %   MCV 90.9  78.0 - 100.0 fL   MCH 31.8  26.0 - 34.0 pg   MCHC 35.0  30.0 - 36.0 g/dL   RDW 40.9  81.1 - 91.4 %   Platelets  166  150 - 400 K/uL   Neutrophils Relative 83 (*) 43 - 77 %   Neutro Abs 10.6 (*) 1.7 - 7.7 K/uL   Lymphocytes Relative 8 (*) 12 - 46 %   Lymphs Abs 1.1  0.7 - 4.0 K/uL   Monocytes Relative 8  3 - 12 %   Monocytes Absolute 1.0  0.1 - 1.0 K/uL   Eosinophils Relative 1  0 - 5 %   Eosinophils Absolute 0.1  0.0 - 0.7 K/uL   Basophils Relative 0  0 - 1 %   Basophils Absolute 0.0  0.0 - 0.1 K/uL   Dg Hip Complete Right  01/04/2013  *RADIOLOGY REPORT*  Clinical Data: Fall  RIGHT HIP - COMPLETE 2+ VIEW  Comparison: None.  Findings: There is a fracture through the mid right femoral neck with slight displacement and rotation of the femoral head.  There is also slight impaction of the neck into the lateral femoral head. Generative changes at L4-5.  IMPRESSION: Minimally displaced right femoral neck fracture.   Original Report Authenticated By: Jolaine Click, M.D.     ROS: Pain with rom of the right lower extremity  Physical Exam: Alert and oriented 57 y.o. male in no acute distress Cranial nerves 2-12 intact Cervical spine: full rom with no tenderness, nv intact distally Chest: active breath sounds  bilaterally, no wheeze rhonchi or rales Heart: regular rate and rhythm, no murmur Abd: non tender non distended with active bowel sounds Right pelvis tender with shortened and externally rotated right lower extremity nv intact distally No rashes or sign of open injury   Assessment/Plan Assessment: right femoral neck fracture  Plan: medicine to admit for surgical management of the right hip Pt in agreement Patient will undergo a right hip hemi arthroplasty by Dr. Ranell Patrick. Risks benefits and expectations were discussed with the patient. Patient understand risks, benefits and expectations and wishes to proceed.

## 2013-01-05 LAB — CBC
HCT: 38.4 % — ABNORMAL LOW (ref 39.0–52.0)
Hemoglobin: 13.4 g/dL (ref 13.0–17.0)
WBC: 10.3 10*3/uL (ref 4.0–10.5)

## 2013-01-05 LAB — BASIC METABOLIC PANEL
BUN: 15 mg/dL (ref 6–23)
Creatinine, Ser: 0.66 mg/dL (ref 0.50–1.35)
GFR calc Af Amer: >90 mL/min (ref >90)
GFR calc non Af Amer: >90 mL/min (ref >90)
Glucose, Bld: 118 mg/dL — ABNORMAL HIGH (ref 70–99)

## 2013-01-05 MED ORDER — METHOCARBAMOL 750 MG PO TABS
750.0000 mg | ORAL_TABLET | Freq: Four times a day (QID) | ORAL | Status: DC
  Administered 2013-01-05 – 2013-01-09 (×17): 750 mg via ORAL
  Filled 2013-01-05 (×23): qty 1

## 2013-01-05 MED ORDER — HALOPERIDOL LACTATE 5 MG/ML IJ SOLN
5.0000 mg | Freq: Once | INTRAMUSCULAR | Status: AC
  Administered 2013-01-05: 5 mg via INTRAVENOUS
  Filled 2013-01-05: qty 1

## 2013-01-05 MED ORDER — LORAZEPAM 2 MG/ML IJ SOLN
0.5000 mg | Freq: Four times a day (QID) | INTRAMUSCULAR | Status: DC
  Administered 2013-01-05 – 2013-01-08 (×15): 0.5 mg via INTRAVENOUS
  Filled 2013-01-05 (×15): qty 1

## 2013-01-05 NOTE — Progress Notes (Signed)
Physical Therapy Treatment Patient Details Name: Larry Daniel MRN: [id number] DOB: [birth year] Today's Date: 01/05/2013 Time: 8469-6295 PT Time Calculation (min): 13 min  PT Assessment / Plan / Recommendation Comments on Treatment Session  Increased time and assistance needed this session. Pt had trouble remaining Daniel throughout session-eyes closed most of it. Recommend CIR vs SNF.     Follow Up Recommendations  CIR. (SNF if CIR not an option)     Does the patient have the potential to tolerate intense rehabilitation     Barriers to Discharge        Equipment Recommendations  Rolling walker with 5" wheels    Recommendations for Other Services OT consult  Frequency Min 4X/week   Plan Discharge plan remains appropriate    Precautions / Restrictions Precautions Precautions: Posterior Hip;Fall Precaution Comments: Reviewed hip precautiions. Pt unable to recall any precautions eduated on during morning session.  Restrictions Weight Bearing Restrictions: No RLE Weight Bearing: Weight bearing as tolerated   Pertinent Vitals/Pain Pt intermittently grimacing in pain and contracting R LE (? Spasms)-unable to rate    Mobility  Bed Mobility Bed Mobility: Sit to Supine Supine to Sit: 1: +2 Total assist Supine to Sit: Patient Percentage: 40% Sit to Supine: 1: +2 Total assist Sit to Supine: Patient Percentage: 60% Details for Bed Mobility Assistance: Assist for R LE onto bed and lateral scoot utilzing bedpad. VCS safety, technique, hand placment, adherence to precautions Transfers Transfers: Sit to Stand;Stand to Sit;Stand Pivot Transfers Sit to Stand: 1: +2 Total assist Sit to Stand: Patient Percentage: 30% Stand to Sit: 1: +2 Total assist Stand to Sit: Patient Percentage: 30% Stand Pivot Transfers: 1: +2 Total assist Stand Pivot Transfers: Patient Percentage: 30% Details for Transfer Assistance: Multimodal cues and increased time. Assist to rise, stabilize, control descent,  slide R LE forward before sitting/standing. Pt with increased difficulty and pain (possibly spasms) this afternoon.  Ambulation/Gait Ambulation/Gait Assistance: Not tested (comment)    Exercises     PT Diagnosis: Difficulty walking;Abnormality of gait;Acute pain  PT Problem List: Decreased strength;Decreased range of motion;Decreased activity tolerance;Decreased balance;Decreased mobility;Pain;Decreased knowledge of use of DME;Decreased safety awareness;Decreased knowledge of precautions PT Treatment Interventions: DME instruction;Gait training;Functional mobility training;Therapeutic activities;Therapeutic exercise;Balance training;Patient/family education   PT Goals Acute Rehab PT Goals PT Goal Formulation: With patient Time For Goal Achievement: 01/19/13 Potential to Achieve Goals: Good Pt will go Supine/Side to Sit: with min assist PT Goal: Supine/Side to Sit - Progress: Goal set today Pt will go Sit to Supine/Side: with supervision PT Goal: Sit to Supine/Side - Progress: Progressing toward goal Pt will go Sit to Stand: with min assist PT Goal: Sit to Stand - Progress: Progressing toward goal Pt will Ambulate: 51 - 150 feet;with min assist;with rolling walker PT Goal: Ambulate - Progress: Goal set today  Visit Information  Last PT Received On: 01/05/13 Assistance Needed: +2 PT/OT Co-Evaluation/Treatment: Yes    Subjective Data  Subjective: I can stand on my own Patient Stated Goal: none stated   Cognition  Cognition Overall Cognitive Status: Impaired Area of Impairment: Problem solving;Safety/judgement;Memory;Attention;Following commands Arousal/Alertness: Lethargic Orientation Level: Disoriented to;Place;Time;Situation Behavior During Session: Lethargic Attention - Other Comments: Pt had eyes closed for almost entire session Memory: Decreased recall of precautions Following Commands: Follows one step commands inconsistently Cognition - Other Comments: Increased  difficulty participating/following commands this pm compared to am session    Balance     End of Session PT - End of Session Equipment Utilized During Treatment: Gait belt  Activity Tolerance: Patient limited by fatigue;Patient limited by pain (Limited by cognition) Patient left: in bed;with call bell/phone within reach;with bed alarm set;Other (comment) (with pillow b/t knees) Nurse Communication: Precautions;Weight bearing status;Mobility status (on white board)   GP     Larry Daniel, MPT Pager: [phone]

## 2013-01-05 NOTE — Progress Notes (Signed)
Clinical Social Work Department BRIEF PSYCHOSOCIAL ASSESSMENT 01/05/2013  Patient:  Larry Daniel, Larry Daniel     Account Number:  [id number]     Admit date:  01/04/2013  Clinical Social Worker:  Dennison Bulla  Date/Time:  01/05/2013 08:35 AM  Referred by:  RN  Date Referred:  01/05/2013 Referred for  SNF Placement   Other Referral:   Interview type:  Patient Other interview type:    PSYCHOSOCIAL DATA Living Status:  FACILITY Admitted from facility:  [facility] Level of care:  Independent Living Primary support name:  Gregary Signs [phone] Primary support relationship to patient:  CHILD, ADULT Degree of support available:   Strong    CURRENT CONCERNS Current Concerns  Post-Acute Placement   Other Concerns:    SOCIAL WORK ASSESSMENT / PLAN CSW received call from RN stating that son is in room with patient and had questions regarding dc plans. CSW reviewed chart and met with patient and son at bedside. Patient drowsy but agreeable to assessment with son's involvement.    CSW introduced myself and explained role. Prior to admission, patient was living at [facility] in the independent living facility. Patient was independent and completed ADL on his own. Patient admitted and had surgery and now needs SNF placement. CSW explained that [facility]W would have to contact [facility] in [facility] to get [facility] for [facility]. CSW explained that [facility] own [facility] and contracts out with other SNFs. Son understanding of [facility] placement and reports that as long as patient gets care, the location of the facility is not a huge factor.    CSW called the [place] and left a message with case managers at [phone] ext [phone] and ext [phone]) CSW will await to hear from [place] and will fax clinicals that [facility] requests. CSW will continue to follow.   Assessment/plan status:  Psychosocial Support/Ongoing Assessment of Needs Other assessment/ plan:   Information/referral to community resources:   SNF information and [facility] approval  process    PATIENT'S/FAMILY'S RESPONSE TO PLAN OF CARE: Patient drowsy and minimally engaged. Patient's son engaged and thankful for CSW time. Son has CSW contact information if further questions arise. Patient agreeable to son being involved with care.

## 2013-01-05 NOTE — Care Management Note (Signed)
    Page 1 of 1   01/05/2013     5:02:27 PM   CARE MANAGEMENT NOTE 01/05/2013  Patient:  Larry Daniel, Larry Daniel   Account Number:  [id number]  Date Initiated:  01/05/2013  Documentation initiated by:  Colleen Can  Subjective/Objective Assessment:   dx displaced femoral neck fracture -right; rt hemiarthroplasty     Action/Plan:   Plans are for SNF placemnt. has been referred to CSW. CIR referral recommended SNF placemnt   Anticipated DC Date:  01/07/2013   Anticipated DC Plan:  SKILLED NURSING FACILITY  In-house referral  Clinical Social Worker      DC Planning Services  CM consult      Choice offered to / List presented to:             Status of service:  Completed, signed off Medicare Important Message given?   (If response is "NO", the following Medicare IM given date fields will be blank) Date Medicare IM given:   Date Additional Medicare IM given:    Discharge Disposition:    Per UR Regulation:  Reviewed for med. necessity/level of care/duration of stay  If discussed at Long Length of Stay Meetings, dates discussed:    Comments:

## 2013-01-05 NOTE — Progress Notes (Addendum)
Clinical Social Work Department CLINICAL SOCIAL WORK PLACEMENT NOTE 01/05/2013  Patient:  Larry Daniel, Larry Daniel  Account Number:  [id number] Admit date:  01/04/2013  Clinical Social Worker:  Unk Lightning, LCSW  Date/time:  01/05/2013 08:35 AM  Clinical Social Work is seeking post-discharge placement for this patient at the following level of care:   SKILLED NURSING   (*CSW will update this form in Epic as items are completed)   01/05/2013  Patient/family provided with [facility] Department of Clinical Social Work's list of facilities offering this level of care within the geographic area requested by the patient (or if unable, by the patient's family).  01/05/2013  Patient/family informed of their freedom to choose among providers that offer the needed level of care, that participate in Medicare, Medicaid or managed care program needed by the patient, have an available bed and are willing to accept the patient.  01/05/2013  Patient/family informed of [facility]' ownership interest in [facility], as well as of the fact that they are under no obligation to receive care at this facility.  PASARR submitted to EDS on 01/05/2013 PASARR number received from EDS on 01/05/2013  FL2 transmitted to all facilities in geographic area requested by pt/family on  01/05/2013 FL2 transmitted to all facilities within larger geographic area on   Patient informed that his/her managed care company has contracts with or will negotiate with  certain facilities, including the following:     Patient/family informed of bed offers received:  01/06/13 Patient chooses bed at [facility] Physician recommends and patient chooses bed at    [facility]  [facility]  01[facility]/13 Patient to be transferred to facility by [facility]  The following physician request were entered in Epic:   Additional Comments: 01/05/13-CSW explained that patient would need to go to facility contracted with [facility]  ins[facility].

## 2013-01-05 NOTE — Evaluation (Signed)
Physical Therapy Evaluation Patient Details Name: Larry Daniel MRN: [id number] DOB: 19-Jan-1956 Today's Date: 01/05/2013 Time: 0865-7846 PT Time Calculation (min): 13 min  PT Assessment / Plan / Recommendation Clinical Impression  57 yo male s/p R hip hemiarthroplasty. Has hx of Parkinson's disease. On eval pt required +2 assist for mobility. Unable to tolerate ambulation during eval due to fatigue, pain. Recommend CIR vs  SNF for continued rehab.     PT Assessment  Patient needs continued PT services    Does the patient have the potential to tolerate intense rehabilitation    CIR. (SNF if CIR not an option)  Barriers to Discharge        Equipment Recommendations  Rolling walker with 5" wheels    Recommendations for Other Services OT consult   Frequency Min 4X/week    Precautions / Restrictions Precautions Precautions: Fall;Posterior Hip Restrictions Weight Bearing Restrictions: No RLE Weight Bearing: Weight bearing as tolerated   Pertinent Vitals/Pain 8/10 R hip      Mobility  Bed Mobility Bed Mobility: Supine to Sit Supine to Sit: 1: +2 Total assist Supine to Sit: Patient Percentage: 40% Details for Bed Mobility Assistance: Assist for trunk to upright and bil LEs off bed. VCs safety, technique, hand placement.  Transfers Transfers: Sit to Stand;Stand to Sit;Stand Pivot Transfers Sit to Stand: 1: +2 Total assist;From bed;From elevated surface Sit to Stand: Patient Percentage: 40% Stand to Sit: 1: +2 Total assist;To chair/3-in-1 Stand to Sit: Patient Percentage: 40% Stand Pivot Transfers: 1: +2 Total assist Stand Pivot Transfers: Patient Percentage: 50% Details for Transfer Assistance: VCS safety, technique, hand placement Assist to rise, stabilize, control descent, slide R LE forward before sitting/standing, maneuver with RW. Pt has difficulty with initiating steps. External assist to advance bil LEs.  Ambulation/Gait Ambulation/Gait Assistance: Not tested  (comment)    Exercises     PT Diagnosis: Difficulty walking;Abnormality of gait;Acute pain  PT Problem List: Decreased strength;Decreased range of motion;Decreased activity tolerance;Decreased balance;Decreased mobility;Pain;Decreased knowledge of use of DME;Decreased safety awareness;Decreased knowledge of precautions PT Treatment Interventions: DME instruction;Gait training;Functional mobility training;Therapeutic activities;Therapeutic exercise;Balance training;Patient/family education   PT Goals Acute Rehab PT Goals PT Goal Formulation: With patient Time For Goal Achievement: 01/19/13 Potential to Achieve Goals: Good Pt will go Supine/Side to Sit: with min assist PT Goal: Supine/Side to Sit - Progress: Goal set today Pt will go Sit to Supine/Side: with min assist PT Goal: Sit to Supine/Side - Progress: Goal set today Pt will go Sit to Stand: with min assist PT Goal: Sit to Stand - Progress: Goal set today Pt will Ambulate: 51 - 150 feet;with min assist;with rolling walker PT Goal: Ambulate - Progress: Goal set today  Visit Information  Last PT Received On: 01/05/13 Assistance Needed: +2    Subjective Data  Subjective: I cant move my feet Patient Stated Goal: Return to ind [facility]   Prior Functioning  Home Living Type of Home: Independent living facility [facility]) Home Adaptive Equipment: Straight cane Prior Function Level of Independence: Independent with assistive device(s) Communication Communication: No difficulties    Cognition  Cognition Overall Cognitive Status: Impaired Area of Impairment: Following commands Arousal/Alertness: Lethargic Behavior During Session: [facility] for tasks performed Following Commands: Follows one step commands with increased time    Extremity/Trunk Assessment Right Lower Extremity Assessment RLE ROM/Strength/Tone: Unable to fully assess;Due to pain Left Lower Extremity Assessment LLE ROM/Strength/Tone: [facility] for tasks  assessed;Deficits LLE ROM/Strength/Tone Deficits: Strength at least 3/5 with functional activity   Balance  End of Session PT - End of Session Equipment Utilized During Treatment: Gait belt Activity Tolerance: Patient limited by fatigue;Patient limited by pain Patient left: in chair;with call bell/phone within reach  GP     [name], MPT Pager: [phone]

## 2013-01-05 NOTE — Progress Notes (Signed)
Pt back from PACU. Vital signs stable. Pt complaining of pain. Pt given medication per MD order. Pt's son at then bedside. Pt's dose of levadopa was not on the unit. While not in the room son gave pt levadopa from home. Son told me after he administered the medication. Pt's son educated by Charity fundraiser and charge nurse about not administering home medication to the pt during hospital stay. Son was re-infromed that all medication has to be dispensed from pharmacy and given by RN.

## 2013-01-05 NOTE — Progress Notes (Signed)
   Subjective: 1 Day Post-Op Procedure(s) (LRB): ARTHROPLASTY BIPOLAR HIP (Right)  Pt resting comfortably in no acute distress  Objective:   VITALS:   Filed Vitals:   01/05/13 0549  BP: 132/74  Pulse: 88  Temp: 97.6 F (36.4 C)  Resp: 16   Right hip incision healing Mild drainage Neurologically intact distally No erythema  LABS  Recent Labs  01/04/13 1247 01/05/13 0500  HGB 15.3 13.4  HCT 43.7 38.4*  WBC 12.8* 10.3  PLT 166 132*     Recent Labs  01/04/13 1247 01/05/13 0500  NA 137 134*  K 3.8 4.1  BUN 16 15  CREATININE 0.77 0.66  GLUCOSE 96 118*     Assessment/Plan: 1 Day Post-Op Procedure(s) (LRB): ARTHROPLASTY BIPOLAR HIP (Right)   Up with therapy as able Weight bearing as tolerated right hip D/c planning   Alphonsa Overall, MPAS, PA-C  01/05/2013, 8:57 AM

## 2013-01-05 NOTE — Progress Notes (Signed)
Rehab Admissions Coordinator Note:  Patient was screened by Larry Daniel for appropriateness for an Inpatient Acute Rehab Consult.  At this time, we are recommending Skilled Nursing Facility. [facility] with SN[facility] for [facility], not IP rehab.  Larry Daniel 01/05/2013, 2:12 PM  I can be reached at [phone].

## 2013-01-05 NOTE — Op Note (Signed)
NAME:  Larry Daniel, Larry Daniel NO.:  [id number]  MEDICAL RECORD NO.:  [id number]  LOCATION:  1511                         FACILITY:  [facility]  PHYSICIAN:  Almedia Balls. Ranell Patrick, M.D. DATE OF BIRTH:  Aug 26, 1956  DATE OF PROCEDURE:  01/04/2013 DATE OF DISCHARGE:                              OPERATIVE REPORT   PREOPERATIVE DIAGNOSIS:  Displaced right femoral neck fracture.  POSTOPERATIVE DIAGNOSIS:  Displaced right femoral neck fracture.  PROCEDURE PERFORMED:  Right hip hemiarthroplasty using DePuy Tri-Lock, unipolar arthroplasty.  ATTENDING SURGEON:  Almedia Balls. Ranell Patrick, M.D.  ASSISTANT:  Thea Gist, PA-C, who was scrubbed in the entire procedure and necessary for satisfactory completion of surgery.  ANESTHESIA:  General anesthesia was used.  ESTIMATED BLOOD LOSS:  200 mL.  FLUID REPLACEMENT:  1500 mL of crystalloid.  INSTRUMENT COUNTS:  Correct.  COMPLICATIONS:  There were no complications.  ANTIBIOTICS:  Perioperative antibiotics were given.  INDICATIONS:  The patient is a 57 year old male with a history of a fall injuring his right hip.  The patient was unable to stand or ambulate after his fall complaining of right hip and groin pain.  X-ray is demonstrating displaced femoral neck fracture.  I counseled the patient and his son regarding the injury given the patient has advanced Parkinson's and has balance issues.  We elected to proceed with hemiarthroplasty which would allow for early mobilization and less concerned about weight bearing.  Informed consent was obtained.  DESCRIPTION OF PROCEDURE:  After adequate level of anesthesia was achieved, the patient was positioned in left lateral decubitus position with the right hip up.  The correct limb was identified.  Time-out called.  Sterile prep and drape performed.  We entered the hip using a Kocher Langenbach approach, starting at the vastus ridge and extending posteriorly along the gluteus maximus.  Dissection  down through subcutaneous tissues using Bovie, dissection down to the tensor fascia lata divided in line with the skin incision.  We retracted the gluteus medius anteriorly.  We divided the piriformis and short external rotators, placed tag sutures in those.  Hematoma was encountered.  We went ahead and dislocated the hip, the fractured femoral neck was unstable.  We went ahead and performed a neck cut 1 fingerbreadth above the lesser trochanter, removed the head and sized it to a 56.  We removed the ligamentum teres.  Trial of the 56 head with nice suction fit.  We broached further Tri-Lock stem, sequentially up to a size 8 Tri- Lock with a high offset stem.  We trialed that with the 56 monopolar head and were happy with leg lengths, soft tissue stability, and offset. We dislocated the hip, irrigated the socket which the cartilage was in excellent condition and the labrum was intact and the labral tear was identified.  We went ahead and thoroughly irrigated.  We then went ahead and impacted the Tri-Lock center about 20 degrees of anteversion.  Tri- Lock stem into position with the appropriate height and offset.  We then placed the +0 neck taper into the monopolar head impacting that and then impacted the head neck combo onto the trunnion for the Tri-Lock prosthesis.  We then reduced the hip.  We  were happy with leg lengths and soft tissue balancing.  We thoroughly irrigated.  We repaired the posterior capsule with interrupted #1 Vicryl figure-of-eight sutures, followed by repair of the piriformis back to the gluteus medius/greater trochanter.  Next, we went ahead and placed the hip in a little bit of abduction and then did a tensor fascia lata repair with interrupted #1 Vicryl suture for the tendinous portion and then ran a #1 suture for the muscular fascia portion, subcu closure with 0 and 2-0 Vicryl, followed by 4-0 Monocryl for skin.  Steri-Strips applied, followed by sterile Mepilex  dressing.  The patient was transported supine and in stable condition to the recovery room.     Almedia Balls. Ranell Patrick, M.D.     SRN/MEDQ  D:  01/04/2013  T:  01/05/2013  Job:  [id number]

## 2013-01-05 NOTE — Progress Notes (Signed)
Clinical Social Work  CSW received a call from [place] [employer]) who reports that more information needs to be reviewed at [facility] before determining if patient is eligible for SNF services through the [place]. [facility] will call [facility]W back with determination. CSW will continue to follow.  [place], [place] [phone]

## 2013-01-05 NOTE — Progress Notes (Signed)
Clinical Social Work  [facility] spoke with [facility] who sent [facility]W referral packet. CSW completed information and faxed back to [place]. CSW spoke with [facility] who reports that patient's information will be reviewed on 01/06/13 at 1100. CSW will await to hear from [place]. CSW shared information with son who called to check on status.  [place], [place] [phone]

## 2013-01-05 NOTE — Progress Notes (Signed)
[facility] PROGRESS NOTE  Larry Daniel ZOX:[id number] DOB: 10-07-1956 DOA: 01/04/2013 PCP: Clance Boll, MD  Assessment/Plan: 1. Displaced femoral neck fracture - At this juncture ortho managing - currently physical therapy on board.   - Placement options currently being evaluated by family and social worker  2. Parkinson's disease - stable continue home regimen  3. Hyponatremia - At this juncture will reevaluate next am. - likely due to poor oral intake.   Code Status: presumed full Family Communication: spoke to son and patient at bedside.  Disposition Plan: Pending further improvement in condition and placement options   Consultants:  Ortho  Procedures:  Please refer to EMR:   Antibiotics:  None   HPI/Subjective: No New complaints. No acute issues reported overnight.  Objective: Filed Vitals:   01/04/13 2100 01/04/13 2206 01/04/13 2251 01/05/13 0549  BP: 159/96 135/85 108/69 132/74  Pulse: 107 99 89 88  Temp: 97.7 F (36.5 C) 97.7 F (36.5 C) 97.8 F (36.6 C) 97.6 F (36.4 C)  TempSrc:  Oral Oral Oral  Resp:  18 16 16   Height:      Weight:      SpO2:  94% 92% 95%    Intake/Output Summary (Last 24 hours) at 01/05/13 1909 Last data filed at 01/05/13 0550  Gross per 24 hour  Intake    280 ml  Output    525 ml  Net   -245 ml   Filed Weights   01/04/13 1509  Weight: 98.3 kg (216 lb 11.4 oz)    Exam:   General:  Pt in NAD, Alert and Awake  Cardiovascular: RRR, No MRG  Respiratory: CTA BL, no wheezes  Abdomen: soft, NT, ND  Musculoskeletal: no cyanosis or clubbing   Data Reviewed: Basic Metabolic Panel:  Recent Labs Lab 01/04/13 1247 01/05/13 0500  NA 137 134*  K 3.8 4.1  CL 101 99  CO2 25 28  GLUCOSE 96 118*  BUN 16 15  CREATININE 0.77 0.66  CALCIUM 9.3 8.5   Liver Function Tests: No results found for this basename: AST, ALT, ALKPHOS, BILITOT, PROT, ALBUMIN,  in the last 168 hours No results found for this  basename: LIPASE, AMYLASE,  in the last 168 hours No results found for this basename: AMMONIA,  in the last 168 hours CBC:  Recent Labs Lab 01/04/13 1247 01/05/13 0500  WBC 12.8* 10.3  NEUTROABS 10.6*  --   HGB 15.3 13.4  HCT 43.7 38.4*  MCV 90.9 90.1  PLT 166 132*   Cardiac Enzymes: No results found for this basename: CKTOTAL, CKMB, CKMBINDEX, TROPONINI,  in the last 168 hours BNP (last 3 results) No results found for this basename: PROBNP,  in the last 8760 hours CBG: No results found for this basename: GLUCAP,  in the last 168 hours  Recent Results (from the past 240 hour(s))  SURGICAL PCR SCREEN     Status: None   Collection Time    01/04/13  3:41 PM      Result Value Range Status   MRSA, PCR NEGATIVE  NEGATIVE Final   Staphylococcus aureus NEGATIVE  NEGATIVE Final   Comment:            The Xpert SA Assay (FDA     approved for NASAL specimens     in patients over 63 years of age),     is one component of     a comprehensive surveillance     program.  Test performance has  been validated by [facility] for patients greater     than or equal to 67 year old.     It is not intended     to diagnose infection nor to     guide or monitor treatment.     Studies: Dg Hip Complete Right  01/04/2013  *RADIOLOGY REPORT*  Clinical Data: Fall  RIGHT HIP - COMPLETE 2+ VIEW  Comparison: None.  Findings: There is a fracture through the mid right femoral neck with slight displacement and rotation of the femoral head.  There is also slight impaction of the neck into the lateral femoral head. Generative changes at L4-5.  IMPRESSION: Minimally displaced right femoral neck fracture.   Original Report Authenticated By: Jolaine Click, M.D.    Dg Pelvis Portable  01/04/2013  *RADIOLOGY REPORT*  Clinical Data: Postop  PORTABLE PELVIS  Comparison: The film earlier in the day  Findings: Right hip hemiarthroplasty has been performed, with satisfactory position and alignment.  IMPRESSION:  As above.   Original Report Authenticated By: Davonna Belling, M.D.     Scheduled Meds: . carbidopa-levodopa  1 tablet Oral BID  . carbidopa-levodopa  1.5 tablet Oral Custom  . citalopram  10 mg Oral QHS  . docusate sodium  100 mg Oral BID  . donepezil  10 mg Oral QHS  . enoxaparin (LOVENOX) injection  40 mg Subcutaneous Q24H  . entacapone  200 mg Oral 5 X Daily  . ferrous sulfate  325 mg Oral TID PC  . LORazepam  0.5 mg Intravenous Q6H  . methocarbamol  750 mg Oral QID   Continuous Infusions: . sodium chloride 50 mL/hr at 01/04/13 2224    Principal Problem:   Fracture of femoral neck, right Active Problems:   Parkinson disease    Time spent: > 35 minutes    Larry Daniel  [facility] Pager [phone]. If 7PM-7AM, please contact night-coverage at [email or web address], password [facility] 01/05/2013, 7:09 PM  LOS: 1 day

## 2013-01-05 NOTE — Evaluation (Signed)
Occupational Therapy Evaluation Patient Details Name: Larry Daniel MRN: [id number] DOB: 04/14/1956 Today's Date: 01/05/2013 Time: 0865-7846 OT Time Calculation (min): 32 min  OT Assessment / Plan / Recommendation Clinical Impression  This 57 year old man with a h/o Parkinson's disease was admitted for R femoral fx which was surgically managed with hemiarthroplasty.  Pt has posterior THPs and is WBAT.  Pt is from [facility] and was mod I with all ADLs prior to admission.  He is appropriate for skilled OT to increase independence and safety with adls with mod A level goals, following thps.      OT Assessment  Patient needs continued OT Services    Follow Up Recommendations  CIR    Barriers to Discharge      Equipment Recommendations  3 in 1 bedside comode    Recommendations for Other Services    Frequency  Min 2X/week    Precautions / Restrictions Precautions Precautions: Fall;Posterior Hip Restrictions Weight Bearing Restrictions: No RLE Weight Bearing: Weight bearing as tolerated   Pertinent Vitals/Pain A lot of pain with movement, RLE.  Repositioned     ADL  Toilet Transfer: Simulated;+2 Total assistance Toilet Transfer: Patient Percentage: 40% (30% for sit to stand) Toilet Transfer Method: Stand pivot Transfers/Ambulation Related to ADLs: SPT back to bed.  Had to move slowly as pt appeared to have full body spasm.  He is 6'5" and chair slightly reclined to add safety net with posterior THPs ADL Comments: reviewed THPs but pt will need reinforcement. Initially pt stayed awake but then needed cues to remain alert.  Attempted to wash face but pt did not follow through with activity:  intention tremor noted.  Will need reinforcement with THPs.  Will further eval whether pt could benefit from AE  At time of eval, pt requires total A x 1 for UB adls and total A x 2 for LB adls.      OT Diagnosis: Generalized weakness  OT Problem List: Decreased  strength;Decreased activity tolerance;Decreased cognition;Decreased safety awareness;Decreased knowledge of use of DME or AE;Pain;Decreased knowledge of precautions;Decreased coordination OT Treatment Interventions: Self-care/ADL training;DME and/or AE instruction;Therapeutic activities;Patient/family education   OT Goals Acute Rehab OT Goals OT Goal Formulation: Patient unable to participate in goal setting Time For Goal Achievement: 01/12/13 Potential to Achieve Goals: Good ADL Goals Pt Will Perform Lower Body Bathing: with mod assist;Sit to stand from chair;with adaptive equipment ADL Goal: Lower Body Bathing - Progress: Goal set today Pt Will Perform Lower Body Dressing: Sit to stand from chair;with adaptive equipment;with max assist ADL Goal: Lower Body Dressing - Progress: Goal set today Pt Will Transfer to Toilet: with mod assist;Stand pivot transfer;3-in-1;Maintaining hip precautions ADL Goal: Toilet Transfer - Progress: Goal set today Pt Will Perform Toileting - Hygiene: with min assist;Standing at 3-in-1/toilet ADL Goal: Toileting - Hygiene - Progress: Goal set today Miscellaneous OT Goals Miscellaneous OT Goal #1: Pt will verbalize 3/3 thps OT Goal: Miscellaneous Goal #1 - Progress: Goal set today Miscellaneous OT Goal #2: Pt will complete upper body adls with supervision, following thps OT Goal: Miscellaneous Goal #2 - Progress: Goal set today  Visit Information  Last OT Received On: 01/05/13 Assistance Needed: +2 PT/OT Co-Evaluation/Treatment: Yes    Subjective Data  Subjective: I'm trying (scoot over in bed) Patient Stated Goal: none stated   Prior Functioning     Home Living Type of Home: Independent living facility Home Adaptive Equipment: Straight cane Additional Comments: from [facility]. Prior Function Level  of Independence: Independent with assistive device(s) Communication Communication: Other (comment) (pt soft spoken, difficult to understand at  times) Dominant Hand: Right         Vision/Perception     Cognition  Cognition Overall Cognitive Status: Impaired Area of Impairment: Following commands Arousal/Alertness: Lethargic Behavior During Session: Lethargic Following Commands: Follows one step commands inconsistently Cognition - Other Comments: cotx with PT this pm.  PT states that pt did better following commands this am.      Extremity/Trunk Assessment Right Upper Extremity Assessment RUE ROM/Strength/Tone: Within functional levels (intention tremor noted) Left Upper Extremity Assessment LUE ROM/Strength/Tone: WFL for tasks assessed (pt has good strength bilaterallyl) Right Lower Extremity Assessment RLE ROM/Strength/Tone: Unable to fully assess;Due to pain Left Lower Extremity Assessment LLE ROM/Strength/Tone: [facility] for tasks assessed;Deficits LLE ROM/Strength/Tone Deficits: Strength at least 3/5 with functional activity     Mobility Bed Mobility  Sit to Supine: 1: +2 Total assist Sit to Supine: Patient Percentage: 60% Details for Bed Mobility Assistance: assist for RLE and to scoot over Transfers Sit to Stand: 1: +2 Total assist;From chair/3-in-1;Without upper extremity assist;With upper extremity assist Sit to Stand: Patient Percentage: 30% Stand to Sit: 1: +2 Total assist;To chair/3-in-1 Stand to Sit: Patient Percentage: 40% Details for Transfer Assistance: multimodal cues for hand, leg placement and to initiate sitting onto bed     Exercise     Balance     End of Session OT - End of Session Activity Tolerance: Patient limited by fatigue Patient left: in bed;with call bell/phone within reach;with bed alarm set  GO     Klay Sobotka 01/05/2013, 3:06 PM  Marica Otter, OTR/L [phone] 01/05/2013

## 2013-01-06 ENCOUNTER — Encounter (HOSPITAL_COMMUNITY): Payer: Self-pay | Admitting: Orthopedic Surgery

## 2013-01-06 DIAGNOSIS — E871 Hypo-osmolality and hyponatremia: Secondary | ICD-10-CM | POA: Diagnosis not present

## 2013-01-06 LAB — BASIC METABOLIC PANEL
BUN: 8 mg/dL (ref 6–23)
Calcium: 8.8 mg/dL (ref 8.4–10.5)
Creatinine, Ser: 0.66 mg/dL (ref 0.50–1.35)
GFR calc Af Amer: >90 mL/min (ref >90)
GFR calc non Af Amer: >90 mL/min (ref >90)

## 2013-01-06 LAB — CBC
Hemoglobin: 13.8 g/dL (ref 13.0–17.0)
MCH: 31.2 pg (ref 26.0–34.0)
Platelets: 132 10*3/uL — ABNORMAL LOW (ref 150–400)
RBC: 4.43 MIL/uL (ref 4.22–5.81)
RDW: 11.8 % (ref 11.5–15.5)
WBC: 9.8 10*3/uL (ref 4.0–10.5)

## 2013-01-06 NOTE — Progress Notes (Signed)
Clinical Social Work  [facility]. CSW left a message with CSW contact information and will await a response. CSW will continue to follow.  [place], [place] [phone]

## 2013-01-06 NOTE — Progress Notes (Signed)
[facility] PROGRESS NOTE  Denzell Colasanti ZOX:[id number] DOB: 1956-09-04 DOA: 01/04/2013 PCP: Clance Boll, MD  Assessment/Plan: 1. Displaced femoral neck fracture - At this juncture ortho managing - currently physical therapy on board and occupational therapy recommends SNF - Placement options currently being evaluated by family and social worker  2. Parkinson's disease - stable continue home regimen  3. Hyponatremia - mild will monitor - likely due to poor oral intake.   Code Status: presumed full Family Communication: spoke to son and patient at bedside.  Disposition Plan: Pending further improvement in condition and placement options   Consultants:  Ortho  Procedures:  Please refer to EMR:   Antibiotics:  None   HPI/Subjective: No New complaints. No acute issues reported overnight.  Objective: Filed Vitals:   01/06/13 0800 01/06/13 1155 01/06/13 1400 01/06/13 1550  BP:   122/76   Pulse:   94   Temp:   97.9 F (36.6 C)   TempSrc:   Oral   Resp: 18 16 18 18   Height:      Weight:      SpO2: 98% 98% 96% 96%    Intake/Output Summary (Last 24 hours) at 01/06/13 1820 Last data filed at 01/06/13 1400  Gross per 24 hour  Intake 1434.17 ml  Output   1550 ml  Net -115.83 ml   Filed Weights   01/04/13 1509  Weight: 98.3 kg (216 lb 11.4 oz)    Exam:   General:  Pt in NAD, Alert and Awake  Cardiovascular: RRR, No MRG  Respiratory: CTA BL, no wheezes  Abdomen: soft, NT, ND  Musculoskeletal: no cyanosis or clubbing   Data Reviewed: Basic Metabolic Panel:  Recent Labs Lab 01/04/13 1247 01/05/13 0500 01/06/13 0500  NA 137 134* 134*  K 3.8 4.1 3.5  CL 101 99 98  CO2 25 28 28   GLUCOSE 96 118* 108*  BUN 16 15 8   CREATININE 0.77 0.66 0.66  CALCIUM 9.3 8.5 8.8   Liver Function Tests: No results found for this basename: AST, ALT, ALKPHOS, BILITOT, PROT, ALBUMIN,  in the last 168 hours No results found for this basename: LIPASE,  AMYLASE,  in the last 168 hours No results found for this basename: AMMONIA,  in the last 168 hours CBC:  Recent Labs Lab 01/04/13 1247 01/05/13 0500 01/06/13 0500  WBC 12.8* 10.3 9.8  NEUTROABS 10.6*  --   --   HGB 15.3 13.4 13.8  HCT 43.7 38.4* 40.1  MCV 90.9 90.1 90.5  PLT 166 132* 132*   Cardiac Enzymes: No results found for this basename: CKTOTAL, CKMB, CKMBINDEX, TROPONINI,  in the last 168 hours BNP (last 3 results) No results found for this basename: PROBNP,  in the last 8760 hours CBG: No results found for this basename: GLUCAP,  in the last 168 hours  Recent Results (from the past 240 hour(s))  SURGICAL PCR SCREEN     Status: None   Collection Time    01/04/13  3:41 PM      Result Value Range Status   MRSA, PCR NEGATIVE  NEGATIVE Final   Staphylococcus aureus NEGATIVE  NEGATIVE Final   Comment:            The Xpert SA Assay (FDA     approved for NASAL specimens     in patients over 51 years of age),     is one component of     a comprehensive surveillance     program.  Test performance has     been validated by [facility] for patients greater     than or equal to 39 year old.     It is not intended     to diagnose infection nor to     guide or monitor treatment.     Studies: Dg Pelvis Portable  01/04/2013  *RADIOLOGY REPORT*  Clinical Data: Postop  PORTABLE PELVIS  Comparison: The film earlier in the day  Findings: Right hip hemiarthroplasty has been performed, with satisfactory position and alignment.  IMPRESSION: As above.   Original Report Authenticated By: Davonna Belling, M.D.     Scheduled Meds: . carbidopa-levodopa  1 tablet Oral BID  . carbidopa-levodopa  1.5 tablet Oral Custom  . citalopram  10 mg Oral QHS  . docusate sodium  100 mg Oral BID  . donepezil  10 mg Oral QHS  . enoxaparin (LOVENOX) injection  40 mg Subcutaneous Q24H  . entacapone  200 mg Oral 5 X Daily  . ferrous sulfate  325 mg Oral TID PC  . LORazepam  0.5 mg Intravenous Q6H   . methocarbamol  750 mg Oral QID   Continuous Infusions: . sodium chloride 50 mL/hr at 01/06/13 1511    Principal Problem:   Fracture of femoral neck, right Active Problems:   Parkinson disease    Time spent: > 30 minutes    Larry Daniel  [facility] Pager [phone]. If 7PM-7AM, please contact night-coverage at [email or web address], password [facility] 01/06/2013, 6:20 PM  LOS: 2 days

## 2013-01-06 NOTE — Progress Notes (Signed)
Physical Therapy Treatment Patient Details Name: Larry Daniel MRN: [id number] DOB: [birth year] Today's Date: 01/06/2013 Time: 7829-5621 PT Time Calculation (min): 53 min  PT Assessment / Plan / Recommendation Comments on Treatment Session  Progressing slowly with mobility. However, pt was much improved overall today compared to yesterday. Recommend SNF for continued rehab    Follow Up Recommendations  SNF;Supervision/Assistance - 24 hour     Does the patient have the potential to tolerate intense rehabilitation     Barriers to Discharge        Equipment Recommendations  Rolling walker with 5" wheels    Recommendations for Other Services    Frequency Min 4X/week   Plan Discharge plan remains appropriate    Precautions / Restrictions Precautions Precautions: Posterior Hip;Fall Precaution Comments: Reviewed hip precautions. Pt unable to recall any precautions. Will continue to require constant cueing and education Restrictions Weight Bearing Restrictions: No RLE Weight Bearing: Weight bearing as tolerated   Pertinent Vitals/Pain Increased pain with activity-pt unable to rate-"Its not too bad"    Mobility  Bed Mobility Bed Mobility: Supine to Sit;Sit to Supine Supine to Sit: 1: +2 Total assist Supine to Sit: Patient Percentage: 50% Sit to Supine: 1: +2 Total assist Sit to Supine: Patient Percentage: 50% Details for Bed Mobility Assistance: Assist for R LE onto bed and lateral scoot utilzing bedpad. VCS safety, technique, hand placment, adherence to precautions. Increased time.  Transfers Transfers: Sit to Stand;Stand to Sit;Stand Pivot Transfers Sit to Stand: 1: +2 Total assist Sit to Stand: Patient Percentage: 70% Stand to Sit: 1: +2 Total assist Stand to Sit: Patient Percentage: 70% Stand Pivot Transfers: 1: +2 Total assist Stand Pivot Transfers: Patient Percentage: 70% Details for Transfer Assistance: Multimodal cues and increased time. Assist to rise, stabilize,  control descent, slide R LE forward before sitting/standing.  Ambulation/Gait Ambulation/Gait Assistance: 1: +2 Total assist Ambulation/Gait: Patient Percentage: 80% Ambulation Distance (Feet): 20 Feet Assistive device: Rolling walker Ambulation/Gait Assistance Details: VCS safety, technique, sequence, posture. Assist to stabilize pt and maneuver with RW. Followed closely with recliner. Fatigues somewhat easily.  Gait Pattern: Step-through pattern;Decreased step length - left;Decreased step length - right;Decreased stride length    Exercises     PT Diagnosis:    PT Problem List:   PT Treatment Interventions:     PT Goals Acute Rehab PT Goals Pt will go Supine/Side to Sit: with min assist PT Goal: Supine/Side to Sit - Progress: Progressing toward goal Pt will go Sit to Supine/Side: with min assist PT Goal: Sit to Supine/Side - Progress: Progressing toward goal Pt will go Sit to Stand: with min assist PT Goal: Sit to Stand - Progress: Progressing toward goal Pt will Ambulate: 51 - 150 feet;with min assist;with rolling walker PT Goal: Ambulate - Progress: Progressing toward goal  Visit Information  Last PT Received On: 01/06/13 Assistance Needed: +2    Subjective Data  Subjective: Its hard for me to picture what im supposed to do Patient Stated Goal: none stated   Cognition  Cognition Overall Cognitive Status: Impaired Area of Impairment: Attention;Memory;Following commands;Problem solving Arousal/Alertness: Lethargic Orientation Level: Disoriented to;Place;Time;Situation Behavior During Session: WFL for tasks performed Attention - Other Comments: eyes closed for most of session Memory: Decreased recall of precautions Following Commands: Follows one step commands with increased time    Balance     End of Session PT - End of Session Equipment Utilized During Treatment: Gait belt Activity Tolerance: Patient limited by fatigue;Patient limited by pain Patient left: in  bed;with call bell/phone within reach   GP     [name], MPT Pager: [phone]

## 2013-01-06 NOTE — Progress Notes (Signed)
Clinical Social Work  CSW spoke with Lenoard Aden from [place] who reports patient was accepted but they cannot accept him to their unit until Monday 01[facility]10/13. [facility] [phone] on [facility]10/13 and asked RN to call report to [phone] the morning of transfer. CSW made MD aware of dc plans.  CSW called patient's son Gregary Signs) and left a message with CSW contact information.   CSW will continue to follow to assist with dc plans.  [place], [place] [phone]

## 2013-01-06 NOTE — Progress Notes (Signed)
   Subjective: 2 Days Post-Op Procedure(s) (LRB): ARTHROPLASTY BIPOLAR HIP (Right)  Mild pain to right hip More alert today  Patient reports pain as mild.  Objective:   VITALS:   Filed Vitals:   01/06/13 0653  BP: 136/84  Pulse: 92  Temp: 100.4 F (38 C)  Resp: 16   Right hip incision healing well Neurologically intact distally No erythema  LABS  Recent Labs  01/04/13 1247 01/05/13 0500 01/06/13 0500  HGB 15.3 13.4 13.8  HCT 43.7 38.4* 40.1  WBC 12.8* 10.3 9.8  PLT 166 132* 132*     Recent Labs  01/04/13 1247 01/05/13 0500 01/06/13 0500  NA 137 134* 134*  K 3.8 4.1 3.5  BUN 16 15 8   CREATININE 0.77 0.66 0.66  GLUCOSE 96 118* 108*     Assessment/Plan: 2 Days Post-Op Procedure(s) (LRB): ARTHROPLASTY BIPOLAR HIP (Right)   Agree with SNF placement Continue PT/OT as able Pain control as needed   Alphonsa Overall, MPAS, PA-C  01/06/2013, 7:58 AM

## 2013-01-06 NOTE — Progress Notes (Signed)
Occupational Therapy Treatment Patient Details Name: Larry Daniel MRN: [id number] DOB: 1956/09/20 Today's Date: 01/06/2013 Time: 3086-5784 OT Time Calculation (min): 53 min  OT Assessment / Plan / Recommendation Comments on Treatment Session      Follow Up Recommendations  SNF    Barriers to Discharge       Equipment Recommendations  3 in 1 bedside comode    Recommendations for Other Services    Frequency Min 2X/week   Plan Discharge plan needs to be updated    Precautions / Restrictions Precautions Precautions: Posterior Hip;Fall Precaution Comments: Reviewed hip precautions. Pt unable to recall any precautions. Will continue to require constant cueing and education Restrictions Weight Bearing Restrictions: No RLE Weight Bearing: Weight bearing as tolerated   Pertinent Vitals/Pain A little pain LLE sitting; moderate when standing    ADL  Lower Body Dressing: +2 Total assistance Lower Body Dressing: Patient Percentage: 10% Where Assessed - Lower Body Dressing: Supported sit to stand with reacher Toilet Transfer: Performed;+2 Total assistance Toilet Transfer: Patient Percentage: 50% Toilet Transfer Method: Surveyor, minerals: Materials engineer and Hygiene: +2 Total assistance Toileting - Clothing Manipulation and Hygiene: Patient Percentage: 0% Transfers/Ambulation Related to ADLs: Pt able to take a few steps.  Much better sequencing steps.  After he sat in chair, he needed to use bathroom.  Pt able to extend L LE but therapist had to block to keep position ADL Comments: Educated on THPs--will need reinforcement.  Used reacher, but pt would benefit from longer model.      OT Diagnosis:    OT Problem List:   OT Treatment Interventions:     OT Goals Acute Rehab OT Goals Time For Goal Achievement: 01/12/13 ADL Goals Pt Will Transfer to Toilet: with mod assist;Stand pivot transfer;3-in-1;Maintaining hip  precautions ADL Goal: Toilet Transfer - Progress: Progressing toward goals Pt Will Perform Toileting - Hygiene: with min assist;Standing at 3-in-1/toilet ADL Goal: Toileting - Hygiene - Progress: Not progressing (yet) Miscellaneous OT Goals Miscellaneous OT Goal #1: Pt will verbalize 3/3 thps OT Goal: Miscellaneous Goal #1 - Progress:  (reviewed not progressing yet--verbalized understanding but unable to state)  Visit Information  Last OT Received On: 01/06/13 Assistance Needed: +2    Subjective Data      Prior Functioning       Cognition  Cognition Overall Cognitive Status: Impaired Area of Impairment: Attention;Memory;Following commands;Problem solving Arousal/Alertness: Lethargic Orientation Level: Disoriented to;Place;Time;Situation Behavior During Session: WFL for tasks performed Attention - Other Comments: eyes closed for most of session Memory: Decreased recall of precautions Following Commands: Follows one step commands with increased time    Mobility  Bed Mobility Bed Mobility: Supine to Sit;Sit to Supine Supine to Sit: 1: +2 Total assist Supine to Sit: Patient Percentage: 50% Sit to Supine: 1: +2 Total assist Sit to Supine: Patient Percentage: 50% Details for Bed Mobility Assistance: Assist for R LE onto bed and lateral scoot utilzing bedpad. VCS safety, technique, hand placment, adherence to precautions. Increased time.  Transfers Sit to Stand: 1: +2 Total assist Sit to Stand: Patient Percentage: 70% (50% from lower surface (chair/3:1), 70% from bed) Stand to Sit: 1: +2 Total assist Stand to Sit: Patient Percentage: 70% Details for Transfer Assistance: Multimodal cues and increased time. Assist to rise, stabilize, control descent, slide R LE forward before sitting/standing.     Exercises      Balance     End of Session OT - End of Session Activity Tolerance:  Patient tolerated treatment well Patient left: in bed;with call bell/phone within reach;with bed  alarm set  GO     Addylin Manke 01/06/2013, 3:09 PM Marica Otter, OTR/L [phone] 01/06/2013

## 2013-01-07 LAB — CBC
MCHC: 35.5 g/dL (ref 30.0–36.0)
Platelets: 135 10*3/uL — ABNORMAL LOW (ref 150–400)
RDW: 11.8 % (ref 11.5–15.5)
WBC: 9 10*3/uL (ref 4.0–10.5)

## 2013-01-07 LAB — BASIC METABOLIC PANEL
BUN: 12 mg/dL (ref 6–23)
GFR calc Af Amer: >90 mL/min (ref >90)
GFR calc non Af Amer: >90 mL/min (ref >90)
Potassium: 3.4 mEq/L — ABNORMAL LOW (ref 3.5–5.1)
Sodium: 137 mEq/L (ref 135–145)

## 2013-01-07 MED ORDER — POTASSIUM CHLORIDE CRYS ER 20 MEQ PO TBCR
40.0000 meq | EXTENDED_RELEASE_TABLET | Freq: Once | ORAL | Status: AC
  Administered 2013-01-07: 40 meq via ORAL
  Filled 2013-01-07: qty 2

## 2013-01-07 MED ORDER — BISACODYL 10 MG RE SUPP: 10.0000 mg | Freq: Every day | RECTAL | Status: DC | PRN

## 2013-01-07 MED ORDER — ENOXAPARIN SODIUM 40 MG/0.4ML ~~LOC~~ SOLN: 40.0000 mg | SUBCUTANEOUS | Status: DC

## 2013-01-07 NOTE — Progress Notes (Signed)
Physical Therapy Treatment Patient Details Name: Larry Daniel MRN: [id number] DOB: 12/30/1955 Today's Date: 01/07/2013 Time: 1000-1023 PT Time Calculation (min): 23 min  PT Assessment / Plan / Recommendation Comments on Treatment Session  Continuing to progress slowly with mobility. Moderate cueing for safety, technique. Recommend SNF.     Follow Up Recommendations  SNF;Supervision/Assistance - 24 hour     Does the patient have the potential to tolerate intense rehabilitation     Barriers to Discharge        Equipment Recommendations  Rolling walker with 5" wheels    Recommendations for Other Services OT consult  Frequency Min 4X/week   Plan Discharge plan remains appropriate    Precautions / Restrictions Precautions Precautions: Posterior Hip;Fall Precaution Comments: Reviewed hip precautions. Pt unable to recall any precautions. Will continue to require constant cueing and education Restrictions Weight Bearing Restrictions: No RLE Weight Bearing: Weight bearing as tolerated   Pertinent Vitals/Pain "hurting a little" at rest; "not too bad" with activity- R hip    Mobility  Bed Mobility Bed Mobility: Supine to Sit Supine to Sit: 1: +2 Total assist Supine to Sit: Patient Percentage: 60% Details for Bed Mobility Assistance: Assist for R LE off bed and lateral scoot utilzing bedpad. VCS safety, technique, hand placment, adherence to precautions. Increased time.  Transfers Transfers: Sit to Stand;Stand to Sit Sit to Stand: 1: +2 Total assist;From bed;From elevated surface Sit to Stand: Patient Percentage: 70% Stand to Sit: 1: +2 Total assist;To chair/3-in-1;With upper extremity assist;With armrests Stand to Sit: Patient Percentage: 70% Details for Transfer Assistance: Multimodal cues and increased time. Assist to rise, stabilize, control descent, slide R LE forward before sitting/standing.  Ambulation/Gait: +2 Total assist: 80% Ambulation Distance (Feet): 65  Feet Assistive device: Rolling walker Ambulation/Gait Assistance Details: VCS safety, technique, sequence, posture, distance from RW. Pt has some difficulty maintaining step to sequencing. Assist to stabilize and maneuver with RW. Fatigued after walk.  Gait Pattern: Step-through pattern;Decreased step length - right;Decreased step length - left;Decreased stride length;Antalgic;Trunk flexed    Exercises     PT Diagnosis:    PT Problem List:   PT Treatment Interventions:     PT Goals Acute Rehab PT Goals Pt will go Supine/Side to Sit: with min assist PT Goal: Supine/Side to Sit - Progress: Progressing toward goal Pt will go Sit to Stand: with min assist PT Goal: Sit to Stand - Progress: Progressing toward goal Pt will Ambulate: 51 - 150 feet;with min assist;with rolling walker PT Goal: Ambulate - Progress: Progressing toward goal  Visit Information  Last PT Received On: 01/07/13 Assistance Needed: +2 (safety)    Subjective Data  Subjective: Im worn out (at end of session) Patient Stated Goal: none stated   Cognition  Cognition Overall Cognitive Status: Impaired Area of Impairment: Attention;Following commands;Memory Arousal/Alertness: Awake/alert Behavior During Session: [facility] for tasks performed Memory: Decreased recall of precautions Following Commands: Follows one step commands with increased time    Balance     End of Session PT - End of Session Equipment Utilized During Treatment: Gait belt Activity Tolerance: Patient limited by fatigue;Patient limited by pain Patient left: in chair;with call bell/phone within reach   GP     [name], MPT Pager: [phone]

## 2013-01-07 NOTE — Progress Notes (Signed)
Orthopedics Progress Note  Subjective: I feel really no pain.  Objective:  Filed Vitals:   01/07/13 0808  BP: 129/81  Pulse: 87  Temp: 98.7 F (37.1 C)  Resp: 18    General: Awake and alert  Musculoskeletal: right hip wound CDI, no draininage Neurovascularly intact  Lab Results  Component Value Date   WBC 9.0 01/07/2013   HGB 12.8* 01/07/2013   HCT 36.1* 01/07/2013   MCV 90.3 01/07/2013   PLT 135* 01/07/2013       Component Value Date/Time   NA 137 01/07/2013 0449   K 3.4* 01/07/2013 0449   CL 101 01/07/2013 0449   CO2 27 01/07/2013 0449   GLUCOSE 124* 01/07/2013 0449   BUN 12 01/07/2013 0449   CREATININE 0.67 01/07/2013 0449   CALCIUM 8.5 01/07/2013 0449   GFRNONAA >90 01/07/2013 0449   GFRAA >90 01/07/2013 0449    No results found for this basename: INR, PROTIME    Assessment/Plan: POD #3 s/p Procedure(s): ARTHROPLASTY HEMIARTHROPLASTY HIP RT  Stable so far.  Continue OOB/mobilization and rehab. Understand the plan is to have him transferred to the [place] on Monday. Will follow.  WBAT  Almedia Balls. Ranell Patrick, MD 01/07/2013 11:02 AM

## 2013-01-07 NOTE — Progress Notes (Signed)
Clinical Social Work  CSW met with patient at bedside and explained transfer to [place] on Monday. Patient reports that son makes decisions and he will talk with him regarding rehab. CSW explained that CSW had informed son of options and son was agreeable. Patient reports he and son will talk about plans over the weekend. CSW will continue to follow to assist with dc planning on 01/10/13.  [place], [place] [phone]

## 2013-01-07 NOTE — Progress Notes (Signed)
Physical Therapy Treatment Patient Details Name: Larry Daniel MRN: [id number] DOB: Feb 25, 1956 Today's Date: 01/07/2013 Time: 4098-1191 PT Time Calculation (min): 19 min  PT Assessment / Plan / Recommendation Comments on Treatment Session  Increased pain this session. Assisted pt back to bed. Recommend SNF    Follow Up Recommendations  SNF;Supervision/Assistance - 24 hour     Does the patient have the potential to tolerate intense rehabilitation     Barriers to Discharge        Equipment Recommendations  Rolling walker with 5" wheels    Recommendations for Other Services OT consult  Frequency Min 4X/week   Plan Discharge plan remains appropriate    Precautions / Restrictions Precautions Precautions: Posterior Hip;Fall Precaution Comments: Reviewed hip precautions. Pt unable to recall any precautions. Will continue to require constant cueing and education Restrictions Weight Bearing Restrictions: No RLE Weight Bearing: Weight bearing as tolerated   Pertinent Vitals/Pain 8/10 after getting back into bed-R hip    Mobility  Bed Mobility Bed Mobility: Sit to Supine Sit to Supine: 1: +2 Total assist Sit to Supine: Patient Percentage: 50% Details for Bed Mobility Assistance: Assist for R LE onto bed and lateral scoot utilzing bedpad. VCS safety, technique, hand placment, adherence to precautions. Increased time.  Transfers Transfers: Sit to Stand;Stand to Sit;Stand Pivot Transfers Sit to Stand: 1: +2 Total assist Sit to Stand: Patient Percentage: 50% Stand to Sit: 1: +2 Total assist Stand to Sit: Patient Percentage: 70% Stand Pivot Transfers: 1: +2 Total assist Stand Pivot Transfers: Patient Percentage: 70% Details for Transfer Assistance: Increased assistance to rise from lower surface (recliner). Multimodal cues and increased time. Assist to rise, stabilize, control descent, manuever with Rw, slide R LE forward before sitting/standing.     Exercises Total Joint  Exercises Ankle Circles/Pumps: AROM;Both;10 reps;Supine Heel Slides:  (attempted but hip too painful, so deferred)   PT Diagnosis:    PT Problem List:   PT Treatment Interventions:     PT Goals Acute Rehab PT Goals Pt will go Sit to Supine/Side: with min assist PT Goal: Sit to Supine/Side - Progress: Progressing toward goal Pt will go Sit to Stand: with min assist PT Goal: Sit to Stand - Progress: Progressing toward goal Pt will Ambulate: 51 - 150 feet;with min assist;with rolling walker PT Goal: Ambulate - Progress: Progressing toward goal  Visit Information  Last PT Received On: 01/07/13 Assistance Needed: +2    Subjective Data  Subjective: Can you tape my socks to my feet so they will stay on? Patient Stated Goal: none stated   Cognition       Balance     End of Session PT - End of Session Activity Tolerance: Patient limited by pain Patient left: in bed;with call bell/phone within reach; with nurse tech in room (asked tech to reset bed alarm after she was done applying condom cath   GP     Larry Daniel, MPT Pager: [phone]

## 2013-01-07 NOTE — Progress Notes (Signed)
[facility] PROGRESS NOTE  Larry Daniel ZOX:[id number] DOB: November 25, 1955 DOA: 01/04/2013 PCP: Clance Boll, MD  Assessment/Plan: 1. Displaced femoral neck fracture - At this juncture ortho managing - currently physical therapy on board and occupational therapy recommends SNF - Placement options currently being evaluated by family and Child psychotherapist.  Patient may have bed in [place] this monday  2. Parkinson's disease - stable continue home regimen  3. Hyponatremia - mild will monitor - likely due to poor oral intake.   Code Status: presumed full Family Communication: spoke to son and patient at bedside.  Disposition Plan: Likely dc monday  Consultants:  Ortho  Procedures:  Please refer to EMR:   Antibiotics:  None   HPI/Subjective: No New complaints. No acute issues reported overnight.  Objective: Filed Vitals:   01/07/13 0557 01/07/13 0808 01/07/13 1136 01/07/13 1411  BP: 122/76 129/81 124/75 129/81  Pulse: 89 87 87 86  Temp: 98.8 F (37.1 C) 98.7 F (37.1 C) 98 F (36.7 C) 98.7 F (37.1 C)  TempSrc: Oral Oral Oral Oral  Resp: 18 18 18 18   Height:      Weight:      SpO2: 94% 97% 95% 97%    Intake/Output Summary (Last 24 hours) at 01/07/13 1543 Last data filed at 01/07/13 1524  Gross per 24 hour  Intake    680 ml  Output   1150 ml  Net   -470 ml   Filed Weights   01/04/13 1509  Weight: 98.3 kg (216 lb 11.4 oz)    Exam:   General:  Pt in NAD, Alert and Awake  Cardiovascular: RRR, No MRG  Respiratory: CTA BL, no wheezes  Abdomen: soft, NT, ND  Musculoskeletal: no cyanosis or clubbing   Data Reviewed: Basic Metabolic Panel:  Recent Labs Lab 01/04/13 1247 01/05/13 0500 01/06/13 0500 01/07/13 0449  NA 137 134* 134* 137  K 3.8 4.1 3.5 3.4*  CL 101 99 98 101  CO2 25 28 28 27   GLUCOSE 96 118* 108* 124*  BUN 16 15 8 12   CREATININE 0.77 0.66 0.66 0.67  CALCIUM 9.3 8.5 8.8 8.5   Liver Function Tests: No results found for  this basename: AST, ALT, ALKPHOS, BILITOT, PROT, ALBUMIN,  in the last 168 hours No results found for this basename: LIPASE, AMYLASE,  in the last 168 hours No results found for this basename: AMMONIA,  in the last 168 hours CBC:  Recent Labs Lab 01/04/13 1247 01/05/13 0500 01/06/13 0500 01/07/13 0449  WBC 12.8* 10.3 9.8 9.0  NEUTROABS 10.6*  --   --   --   HGB 15.3 13.4 13.8 12.8*  HCT 43.7 38.4* 40.1 36.1*  MCV 90.9 90.1 90.5 90.3  PLT 166 132* 132* 135*   Cardiac Enzymes: No results found for this basename: CKTOTAL, CKMB, CKMBINDEX, TROPONINI,  in the last 168 hours BNP (last 3 results) No results found for this basename: PROBNP,  in the last 8760 hours CBG: No results found for this basename: GLUCAP,  in the last 168 hours  Recent Results (from the past 240 hour(s))  SURGICAL PCR SCREEN     Status: None   Collection Time    01/04/13  3:41 PM      Result Value Range Status   MRSA, PCR NEGATIVE  NEGATIVE Final   Staphylococcus aureus NEGATIVE  NEGATIVE Final   Comment:            The Xpert SA Assay (FDA  approved for NASAL specimens     in patients over 9 years of age),     is one component of     a comprehensive surveillance     program.  Test performance has     been validated by [employer] for patients greater     than or equal to 36 year old.     It is not intended     to diagnose infection nor to     guide or monitor treatment.     Studies: No results found.  Scheduled Meds: . carbidopa-levodopa  1 tablet Oral BID  . carbidopa-levodopa  1.5 tablet Oral Custom  . citalopram  10 mg Oral QHS  . docusate sodium  100 mg Oral BID  . donepezil  10 mg Oral QHS  . enoxaparin (LOVENOX) injection  40 mg Subcutaneous Q24H  . entacapone  200 mg Oral 5 X Daily  . ferrous sulfate  325 mg Oral TID PC  . LORazepam  0.5 mg Intravenous Q6H  . methocarbamol  750 mg Oral QID   Continuous Infusions: . sodium chloride 50 mL/hr at 01/07/13 1517    Principal  Problem:   Fracture of femoral neck, right Active Problems:   Parkinson disease   Hyponatremia    Time spent: > 30 minutes    Penny Pia  [facility] Pager [phone]. If 7PM-7AM, please contact night-coverage at [email or web address], password [facility] 01/07/2013, 3:43 PM  LOS: 3 days

## 2013-01-08 NOTE — Plan of Care (Signed)
Problem: Phase I Progression Outcomes Goal: Sutures/staples intact Outcome: Not Applicable Date Met:  01/08/13 Has steri-strips in place

## 2013-01-08 NOTE — Progress Notes (Signed)
[facility] PROGRESS NOTE  Larry Daniel ZOX:[id number] DOB: 1956-01-14 DOA: 01/04/2013 PCP: Clance Boll, MD 57 y/o with history of Parkinsons who follows at the [place] presented to the ED after a mechanical fall.  Subsequently had a mildly displaced right femoral neck fracture.  At this juncture we have been waiting for placement after orthopaedic procedure that can provide physical therapy.  Assessment/Plan: 1. Displaced femoral neck fracture - At this juncture ortho managing - currently physical therapy on board and occupational therapy recommends SNF - Placement options currently being evaluated by family and Child psychotherapist.  Patient may have bed in [place] this monday  2. Parkinson's disease - stable continue home regimen  3. Hyponatremia - likely due to poor oral intake. - resolved with improved oral intake and initial IVF's.   Code Status: presumed full Family Communication: spoke to son and patient at bedside.  Disposition Plan: Likely dc monday  Consultants:  Ortho  Procedures:  Please refer to EMR:   Antibiotics:  None   HPI/Subjective: No acute issues overnight.  No new complaints.  Objective: Filed Vitals:   01/07/13 2114 01/08/13 0000 01/08/13 0527 01/08/13 1414  BP: 127/73  137/88 144/92  Pulse: 84  82 85  Temp: 98.9 F (37.2 C)  98.6 F (37 C) 98.1 F (36.7 C)  TempSrc: Oral  Oral Oral  Resp: 18 16 17 18   Height:      Weight:      SpO2: 96% 95% 95% 98%    Intake/Output Summary (Last 24 hours) at 01/08/13 1548 Last data filed at 01/08/13 1500  Gross per 24 hour  Intake 1465.76 ml  Output   1350 ml  Net 115.76 ml   Filed Weights   01/04/13 1509  Weight: 98.3 kg (216 lb 11.4 oz)    Exam:   General:  Pt in NAD, Alert and Awake  Cardiovascular: RRR, No MRG  Respiratory: CTA BL, no wheezes  Abdomen: soft, NT, ND  Musculoskeletal: no cyanosis or clubbing   Data Reviewed: Basic Metabolic Panel:  Recent Labs Lab  01/04/13 1247 01/05/13 0500 01/06/13 0500 01/07/13 0449  NA 137 134* 134* 137  K 3.8 4.1 3.5 3.4*  CL 101 99 98 101  CO2 25 28 28 27   GLUCOSE 96 118* 108* 124*  BUN 16 15 8 12   CREATININE 0.77 0.66 0.66 0.67  CALCIUM 9.3 8.5 8.8 8.5   Liver Function Tests: No results found for this basename: AST, ALT, ALKPHOS, BILITOT, PROT, ALBUMIN,  in the last 168 hours No results found for this basename: LIPASE, AMYLASE,  in the last 168 hours No results found for this basename: AMMONIA,  in the last 168 hours CBC:  Recent Labs Lab 01/04/13 1247 01/05/13 0500 01/06/13 0500 01/07/13 0449  WBC 12.8* 10.3 9.8 9.0  NEUTROABS 10.6*  --   --   --   HGB 15.3 13.4 13.8 12.8*  HCT 43.7 38.4* 40.1 36.1*  MCV 90.9 90.1 90.5 90.3  PLT 166 132* 132* 135*   Cardiac Enzymes: No results found for this basename: CKTOTAL, CKMB, CKMBINDEX, TROPONINI,  in the last 168 hours BNP (last 3 results) No results found for this basename: PROBNP,  in the last 8760 hours CBG: No results found for this basename: GLUCAP,  in the last 168 hours  Recent Results (from the past 240 hour(s))  SURGICAL PCR SCREEN     Status: None   Collection Time    01/04/13  3:41 PM  Result Value Range Status   MRSA, PCR NEGATIVE  NEGATIVE Final   Staphylococcus aureus NEGATIVE  NEGATIVE Final   Comment:            The Xpert SA Assay (FDA     approved for NASAL specimens     in patients over 34 years of age),     is one component of     a comprehensive surveillance     program.  Test performance has     been validated by [employer] for patients greater     than or equal to 30 year old.     It is not intended     to diagnose infection nor to     guide or monitor treatment.     Studies: No results found.  Scheduled Meds: . carbidopa-levodopa  1 tablet Oral BID  . carbidopa-levodopa  1.5 tablet Oral Custom  . citalopram  10 mg Oral QHS  . docusate sodium  100 mg Oral BID  . donepezil  10 mg Oral QHS  .  enoxaparin (LOVENOX) injection  40 mg Subcutaneous Q24H  . entacapone  200 mg Oral 5 X Daily  . ferrous sulfate  325 mg Oral TID PC  . LORazepam  0.5 mg Intravenous Q6H  . methocarbamol  750 mg Oral QID   Continuous Infusions: . sodium chloride 50 mL/hr at 01/08/13 1500    Principal Problem:   Fracture of femoral neck, right Active Problems:   Parkinson disease   Hyponatremia    Time spent: > 30 minutes    Penny Pia  [facility] Pager [phone]. If 7PM-7AM, please contact night-coverage at [email or web address], password [facility] 01/08/2013, 3:48 PM  LOS: 4 days

## 2013-01-08 NOTE — Progress Notes (Signed)
   Subjective: 4 Days Post-Op Procedure(s) (LRB): ARTHROPLASTY BIPOLAR HIP (Right)   Patient reports pain as mild, pain well controlled. States that hr is doing well. No events throughout the night.  Objective:   VITALS:   Filed Vitals:   01/08/13 1414  BP: 144/92  Pulse: 85  Temp: 98.1 F (36.7 C)  Resp: 18    Neurovascular intact Dorsiflexion/Plantar flexion intact Incision: dressing C/D/I  LABS  Recent Labs  01/06/13 0500 01/07/13 0449  HGB 13.8 12.8*  HCT 40.1 36.1*  WBC 9.8 9.0  PLT 132* 135*     Recent Labs  01/06/13 0500 01/07/13 0449  NA 134* 137  K 3.5 3.4*  BUN 8 12  CREATININE 0.66 0.67  GLUCOSE 108* 124*     Assessment/Plan: 4 Days Post-Op Procedure(s) (LRB): ARTHROPLASTY BIPOLAR HIP (Right)   Up with therapy, WBAT Norco for pain Lovenox for anticoagulation   Anastasio Auerbach. Brigitte Soderberg   PAC  01/08/2013, 8:22 PM

## 2013-01-09 DIAGNOSIS — S72009A Fracture of unspecified part of neck of unspecified femur, initial encounter for closed fracture: Secondary | ICD-10-CM

## 2013-01-09 DIAGNOSIS — G20 Parkinson's disease: Secondary | ICD-10-CM

## 2013-01-09 DIAGNOSIS — E871 Hypo-osmolality and hyponatremia: Secondary | ICD-10-CM

## 2013-01-09 MED ORDER — LORAZEPAM 0.5 MG PO TABS
0.5000 mg | ORAL_TABLET | Freq: Four times a day (QID) | ORAL | Status: DC | PRN
Start: 2013-01-09 — End: 2013-01-10
  Administered 2013-01-09: 0.5 mg via ORAL
  Filled 2013-01-09: qty 1

## 2013-01-09 NOTE — Progress Notes (Signed)
Larry Daniel  MRN: [id number] DOB/Age: Aug 18, 1956 57 y.o. Physician: Lynnea Maizes, M.D. 5 Days Post-Op Procedure(s) (LRB): ARTHROPLASTY BIPOLAR HIP (Right)  Subjective: C/o minimal pain Vital Signs Temp:  [97.5 F (36.4 C)-98.4 F (36.9 C)] 98.4 F (36.9 C) (03/23 0600) Pulse Rate:  [83-88] 83 (03/23 0600) Resp:  [18-20] 20 (03/23 0600) BP: (133-146)/(85-101) 133/85 mmHg (03/23 0600) SpO2:  [95 %-98 %] 98 % (03/23 0600)  Lab Results  Recent Labs  01/07/13 0449  WBC 9.0  HGB 12.8*  HCT 36.1*  PLT 135*   BMET  Recent Labs  01/07/13 0449  NA 137  K 3.4*  CL 101  CO2 27  GLUCOSE 124*  BUN 12  CREATININE 0.67  CALCIUM 8.5   No results found for this basename: inr     Exam  Transferred to bed with minimal assist. Incision with surrounding resolving ecchymosis, no d/c.  Plan Continue to mobilize, total hip precautions,  SNF Larry Daniel M 01/09/2013, 9:18 AM

## 2013-01-09 NOTE — Plan of Care (Signed)
Problem: Phase III Progression Outcomes Goal: Discharge plan remains appropriate-arrangements made Outcome: Progressing Transfer to [facility] 01-10-13

## 2013-01-09 NOTE — Progress Notes (Signed)
Physical Therapy Treatment Patient Details Name: Larry Daniel MRN: [id number] DOB: [birth year] Today's Date: 01/09/2013 Time: 4098-1191 PT Time Calculation (min): 17 min  PT Assessment / Plan / Recommendation Comments on Treatment Session  Pt progressing very well and RN tech states that he has ambulated on several occasions.  Continues to need min to moderate assist for mobility and max cues for hip precautions.     Follow Up Recommendations  SNF;Supervision/Assistance - 24 hour     Does the patient have the potential to tolerate intense rehabilitation     Barriers to Discharge        Equipment Recommendations  Rolling walker with 5" wheels    Recommendations for Other Services    Frequency Min 4X/week   Plan Discharge plan remains appropriate    Precautions / Restrictions Precautions Precautions: Posterior Hip;Fall Precaution Comments: Reviewed hip precautions. Pt unable to recall any precautions. Will continue to require constant cueing and education Restrictions Weight Bearing Restrictions: No RLE Weight Bearing: Weight bearing as tolerated   Pertinent Vitals/Pain Denies pain    Mobility  Bed Mobility Bed Mobility: Supine to Sit;Sit to Supine Supine to Sit: 3: Mod assist Sit to Supine: 3: Mod assist;HOB flat Details for Bed Mobility Assistance: Assist for RLE out of bed and for trunk to attain sitting position with assist for BLEs into bed.  Max cues for hand placement and to self assist, however pt with increased difficulty following commands and maintaining THP.   Transfers Transfers: Sit to Stand;Stand to Sit Sit to Stand: 4: Min assist;With upper extremity assist;From bed Stand to Sit: 4: Min assist;With upper extremity assist;To bed Details for Transfer Assistance: Some assist to rise, steady and ensure controlled descent with max cues for hand placement, LE management and maintaining THP.  Ambulation/Gait Ambulation/Gait Assistance: 1: +2 Total  assist Ambulation/Gait: Patient Percentage: 80% Ambulation Distance (Feet): 150 Feet Assistive device: Rolling walker Ambulation/Gait Assistance Details: Assist to steady throughout with cues for upright posture, maintaining position inside of RW and maintaining THP when turning.  Gait Pattern: Step-through pattern;Decreased step length - right;Decreased step length - left;Decreased stride length;Antalgic;Trunk flexed    Exercises     PT Diagnosis:    PT Problem List:   PT Treatment Interventions:     PT Goals Acute Rehab PT Goals PT Goal Formulation: With patient Time For Goal Achievement: 01/19/13 Potential to Achieve Goals: Good Pt will go Supine/Side to Sit: with min assist PT Goal: Supine/Side to Sit - Progress: Progressing toward goal Pt will go Sit to Supine/Side: with min assist PT Goal: Sit to Supine/Side - Progress: Progressing toward goal Pt will go Sit to Stand: with supervision PT Goal: Sit to Stand - Progress: Updated due to goal met Pt will Ambulate: 51 - 150 feet;with min assist;with rolling walker PT Goal: Ambulate - Progress: Progressing toward goal  Visit Information  Last PT Received On: 01/09/13 Assistance Needed: +1    Subjective Data  Subjective: I'm sorry I didn't do as well.  Patient Stated Goal: none stated   Cognition  Cognition Overall Cognitive Status: Impaired Area of Impairment: Following commands;Memory;Safety/judgement Arousal/Alertness: Awake/alert Orientation Level: Disoriented to;Place;Time;Situation Behavior During Session: WFL for tasks performed Memory: Decreased recall of precautions Following Commands: Follows one step commands with increased time Safety/Judgement: Decreased awareness of safety precautions;Decreased safety judgement for tasks assessed    Balance     End of Session PT - End of Session Activity Tolerance: Patient tolerated treatment well Patient left: in bed;with call bell/phone  within reach;with bed alarm  set Nurse Communication: Precautions;Weight bearing status;Mobility status   GP     Vista Deck 01/09/2013, 5:48 PM

## 2013-01-09 NOTE — Progress Notes (Signed)
[facility] PROGRESS NOTE  Larry Daniel WUJ:[id number] DOB: 16-Dec-1955 DOA: 01/04/2013 PCP: Clance Boll, MD 57 y/o with history of Parkinsons who follows at the [place] presented to the ED after a mechanical fall.  Subsequently had a mildly displaced right femoral neck fracture.  At this juncture we have been waiting for placement after orthopaedic procedure that can provide physical therapy.  Assessment/Plan: 1. Displaced femoral neck fracture - At this juncture ortho managing - currently physical therapy on board and occupational therapy recommends SNF - awaiting Placement - may have bed in [place] this monday  2. Parkinson's disease - stable continue home regimen  3. Hyponatremia - likely due to poor oral intake. - resolved with improved oral intake and initial IVF's.   Code Status: presumed full Family Communication: spoke to son and patient at bedside.  Disposition Plan: Likely dc monday  Consultants:  Ortho  Procedures: ARTHROPLASTY BIPOLAR HIP (Right), DePuy Triloc Unipolar hip - Per Dr Ranell Patrick   Antibiotics:  None   HPI/Subjective: No acute issues overnight.  No new complaints.  Objective: Filed Vitals:   01/08/13 1414 01/08/13 2200 01/08/13 2255 01/09/13 0600  BP: 144/92 146/101 142/90 133/85  Pulse: 85 88  83  Temp: 98.1 F (36.7 C) 97.5 F (36.4 C)  98.4 F (36.9 C)  TempSrc: Oral Oral  Oral  Resp: 18 18  20   Height:      Weight:      SpO2: 98% 95%  98%    Intake/Output Summary (Last 24 hours) at 01/09/13 1142 Last data filed at 01/09/13 0900  Gross per 24 hour  Intake 1705.76 ml  Output   1450 ml  Net 255.76 ml   Filed Weights   01/04/13 1509  Weight: 98.3 kg (216 lb 11.4 oz)    Exam:   General:  Pt in NAD, Alert and Awake  Cardiovascular: RRR, No MRG  Respiratory: CTA BL, no wheezes  Abdomen: soft, NT, ND  Extremities: no cyanosis or clubbing, resting tremor of LUE present  Data Reviewed: Basic Metabolic  Panel:  Recent Labs Lab 01/04/13 1247 01/05/13 0500 01/06/13 0500 01/07/13 0449  NA 137 134* 134* 137  K 3.8 4.1 3.5 3.4*  CL 101 99 98 101  CO2 25 28 28 27   GLUCOSE 96 118* 108* 124*  BUN 16 15 8 12   CREATININE 0.77 0.66 0.66 0.67  CALCIUM 9.3 8.5 8.8 8.5   Liver Function Tests: No results found for this basename: AST, ALT, ALKPHOS, BILITOT, PROT, ALBUMIN,  in the last 168 hours No results found for this basename: LIPASE, AMYLASE,  in the last 168 hours No results found for this basename: AMMONIA,  in the last 168 hours CBC:  Recent Labs Lab 01/04/13 1247 01/05/13 0500 01/06/13 0500 01/07/13 0449  WBC 12.8* 10.3 9.8 9.0  NEUTROABS 10.6*  --   --   --   HGB 15.3 13.4 13.8 12.8*  HCT 43.7 38.4* 40.1 36.1*  MCV 90.9 90.1 90.5 90.3  PLT 166 132* 132* 135*   Cardiac Enzymes: No results found for this basename: CKTOTAL, CKMB, CKMBINDEX, TROPONINI,  in the last 168 hours BNP (last 3 results) No results found for this basename: PROBNP,  in the last 8760 hours CBG: No results found for this basename: GLUCAP,  in the last 168 hours  Recent Results (from the past 240 hour(s))  SURGICAL PCR SCREEN     Status: None   Collection Time    01/04/13  3:41 PM  Result Value Range Status   MRSA, PCR NEGATIVE  NEGATIVE Final   Staphylococcus aureus NEGATIVE  NEGATIVE Final   Comment:            The Xpert SA Assay (FDA     approved for NASAL specimens     in patients over 60 years of age),     is one component of     a comprehensive surveillance     program.  Test performance has     been validated by [employer] for patients greater     than or equal to 75 year old.     It is not intended     to diagnose infection nor to     guide or monitor treatment.     Studies: No results found.  Scheduled Meds: . carbidopa-levodopa  1 tablet Oral BID  . carbidopa-levodopa  1.5 tablet Oral Custom  . citalopram  10 mg Oral QHS  . docusate sodium  100 mg Oral BID  .  donepezil  10 mg Oral QHS  . enoxaparin (LOVENOX) injection  40 mg Subcutaneous Q24H  . entacapone  200 mg Oral 5 X Daily  . ferrous sulfate  325 mg Oral TID PC  . methocarbamol  750 mg Oral QID   Continuous Infusions:    Principal Problem:   Fracture of femoral neck, right Active Problems:   Parkinson disease   Hyponatremia    Time spent: 25 minutes    Janean Eischen C  [facility] Pager [phone]. If 7PM-7AM, please contact night-coverage at [email or web address], password [facility] 01/09/2013, 11:42 AM  LOS: 5 days

## 2013-01-10 LAB — BASIC METABOLIC PANEL
Chloride: 100 mEq/L (ref 96–112)
Creatinine, Ser: 0.64 mg/dL (ref 0.50–1.35)
GFR calc Af Amer: >90 mL/min (ref >90)
Sodium: 138 mEq/L (ref 135–145)

## 2013-01-10 NOTE — Progress Notes (Signed)
Clinical Social Work  CSW met with patient at bedside to inform him of [facility] to [place] today[phone] CSW prepared dc packet and placed in shadow chart. CSW informed RN of dc plans. CSW is signing off but available if further needs arise.  [place], [place] [phone]

## 2013-01-10 NOTE — Progress Notes (Signed)
Orthopedics Progress Note  Subjective: I walked a lot yesterday  Objective:  Filed Vitals:   01/10/13 0400  BP:   Pulse:   Temp:   Resp: 18    General: Awake and alert  Musculoskeletal: right hip incision clean and intact, scant serous drainage, no erythema Neurovascularly intact  Lab Results  Component Value Date   WBC 9.0 01/07/2013   HGB 12.8* 01/07/2013   HCT 36.1* 01/07/2013   MCV 90.3 01/07/2013   PLT 135* 01/07/2013       Component Value Date/Time   NA 138 01/10/2013 0511   K 3.4* 01/10/2013 0511   CL 100 01/10/2013 0511   CO2 30 01/10/2013 0511   GLUCOSE 102* 01/10/2013 0511   BUN 13 01/10/2013 0511   CREATININE 0.64 01/10/2013 0511   CALCIUM 9.1 01/10/2013 0511   GFRNONAA >90 01/10/2013 0511   GFRAA >90 01/10/2013 0511    No results found for this basename: INR, PROTIME    Assessment/Plan: POD #6 s/p Procedure(s): ARTHROPLASTY BIPOLAR HIP Plan is for transfer to the [facility] medical center for rehab WBAT, hip precautions, DVT prophylaxis with mechanical and Lovenox Follow up with me in two weeks Keep dry dressing change daily Almedia Balls. Ranell Patrick, MD 01/10/2013 6:26 AM

## 2013-01-10 NOTE — Discharge Summary (Signed)
Physician Discharge Summary   Patient ID: Larry Daniel MRN: [id number] DOB/AGE: 04-14-1956 57 y.o.  Admit date: 01/04/2013 Discharge date: 01/10/2013  Admission Diagnoses:  Principal Problem:   Fracture of femoral neck, right Active Problems:   Parkinson disease   Hyponatremia   Discharge Diagnoses:  Same   Surgeries: Procedure(s): ARTHROPLASTY BIPOLAR HIP on 01/04/2013   Consultants: physical therapy, occupational therapy Discharged Condition: Stable  Hospital Course: Larry Daniel is an 57 y.o. male who was admitted 01/04/2013 with a chief complaint of  Chief Complaint  Patient presents with  . Fall  . right hip pain   , and found to have a diagnosis of Fracture of femoral neck, right.  They were brought to the operating room on 01/04/2013 and underwent the above named procedures.    The patient had an uncomplicated hospital course and was stable for discharge.  Recent vital signs:  Filed Vitals:   01/10/13 0400  BP:   Pulse:   Temp:   Resp: 18    Recent laboratory studies:  Results for orders placed during the hospital encounter of 01/04/13  SURGICAL PCR SCREEN      Result Value Range   MRSA, PCR NEGATIVE  NEGATIVE   Staphylococcus aureus NEGATIVE  NEGATIVE  BASIC METABOLIC PANEL      Result Value Range   Sodium 137  135 - 145 mEq/L   Potassium 3.8  3.5 - 5.1 mEq/L   Chloride 101  96 - 112 mEq/L   CO2 25  19 - 32 mEq/L   Glucose, Bld 96  70 - 99 mg/dL   BUN 16  6 - 23 mg/dL   Creatinine, Ser 4.09  0.50 - 1.35 mg/dL   Calcium 9.3  8.4 - 81.1 mg/dL   GFR calc non Af Amer >90  >90 mL/min   GFR calc Af Amer >90  >90 mL/min  CBC WITH DIFFERENTIAL      Result Value Range   WBC 12.8 (*) 4.0 - 10.5 K/uL   RBC 4.81  4.22 - 5.81 MIL/uL   Hemoglobin 15.3  13.0 - 17.0 g/dL   HCT 91.4  78.2 - 95.6 %   MCV 90.9  78.0 - 100.0 fL   MCH 31.8  26.0 - 34.0 pg   MCHC 35.0  30.0 - 36.0 g/dL   RDW 21.3  08.6 - 57.8 %   Platelets 166  150 - 400 K/uL   Neutrophils Relative 83 (*) 43 - 77 %   Neutro Abs 10.6 (*) 1.7 - 7.7 K/uL   Lymphocytes Relative 8 (*) 12 - 46 %   Lymphs Abs 1.1  0.7 - 4.0 K/uL   Monocytes Relative 8  3 - 12 %   Monocytes Absolute 1.0  0.1 - 1.0 K/uL   Eosinophils Relative 1  0 - 5 %   Eosinophils Absolute 0.1  0.0 - 0.7 K/uL   Basophils Relative 0  0 - 1 %   Basophils Absolute 0.0  0.0 - 0.1 K/uL  CBC      Result Value Range   WBC 10.3  4.0 - 10.5 K/uL   RBC 4.26  4.22 - 5.81 MIL/uL   Hemoglobin 13.4  13.0 - 17.0 g/dL   HCT 46.9 (*) 62.9 - 52.8 %   MCV 90.1  78.0 - 100.0 fL   MCH 31.5  26.0 - 34.0 pg   MCHC 34.9  30.0 - 36.0 g/dL   RDW 41.3  24.4 - 01.0 %   Platelets  132 (*) 150 - 400 K/uL  BASIC METABOLIC PANEL      Result Value Range   Sodium 134 (*) 135 - 145 mEq/L   Potassium 4.1  3.5 - 5.1 mEq/L   Chloride 99  96 - 112 mEq/L   CO2 28  19 - 32 mEq/L   Glucose, Bld 118 (*) 70 - 99 mg/dL   BUN 15  6 - 23 mg/dL   Creatinine, Ser 1.61  0.50 - 1.35 mg/dL   Calcium 8.5  8.4 - 09.6 mg/dL   GFR calc non Af Amer >90  >90 mL/min   GFR calc Af Amer >90  >90 mL/min  CBC      Result Value Range   WBC 9.8  4.0 - 10.5 K/uL   RBC 4.43  4.22 - 5.81 MIL/uL   Hemoglobin 13.8  13.0 - 17.0 g/dL   HCT 04.5  40.9 - 81.1 %   MCV 90.5  78.0 - 100.0 fL   MCH 31.2  26.0 - 34.0 pg   MCHC 34.4  30.0 - 36.0 g/dL   RDW 91.4  78.2 - 95.6 %   Platelets 132 (*) 150 - 400 K/uL  BASIC METABOLIC PANEL      Result Value Range   Sodium 134 (*) 135 - 145 mEq/L   Potassium 3.5  3.5 - 5.1 mEq/L   Chloride 98  96 - 112 mEq/L   CO2 28  19 - 32 mEq/L   Glucose, Bld 108 (*) 70 - 99 mg/dL   BUN 8  6 - 23 mg/dL   Creatinine, Ser 2.13  0.50 - 1.35 mg/dL   Calcium 8.8  8.4 - 08.6 mg/dL   GFR calc non Af Amer >90  >90 mL/min   GFR calc Af Amer >90  >90 mL/min  CBC      Result Value Range   WBC 9.0  4.0 - 10.5 K/uL   RBC 4.00 (*) 4.22 - 5.81 MIL/uL   Hemoglobin 12.8 (*) 13.0 - 17.0 g/dL   HCT 57.8 (*) 46.9 - 62.9 %   MCV 90.3   78.0 - 100.0 fL   MCH 32.0  26.0 - 34.0 pg   MCHC 35.5  30.0 - 36.0 g/dL   RDW 52.8  41.3 - 24.4 %   Platelets 135 (*) 150 - 400 K/uL  BASIC METABOLIC PANEL      Result Value Range   Sodium 137  135 - 145 mEq/L   Potassium 3.4 (*) 3.5 - 5.1 mEq/L   Chloride 101  96 - 112 mEq/L   CO2 27  19 - 32 mEq/L   Glucose, Bld 124 (*) 70 - 99 mg/dL   BUN 12  6 - 23 mg/dL   Creatinine, Ser 0.10  0.50 - 1.35 mg/dL   Calcium 8.5  8.4 - 27.2 mg/dL   GFR calc non Af Amer >90  >90 mL/min   GFR calc Af Amer >90  >90 mL/min  BASIC METABOLIC PANEL      Result Value Range   Sodium 138  135 - 145 mEq/L   Potassium 3.4 (*) 3.5 - 5.1 mEq/L   Chloride 100  96 - 112 mEq/L   CO2 30  19 - 32 mEq/L   Glucose, Bld 102 (*) 70 - 99 mg/dL   BUN 13  6 - 23 mg/dL   Creatinine, Ser 5.36  0.50 - 1.35 mg/dL   Calcium 9.1  8.4 - 64.4 mg/dL   GFR calc  non Af Amer >90  >90 mL/min   GFR calc Af Amer >90  >90 mL/min  TYPE AND SCREEN      Result Value Range   ABO/RH(D) O POS     Antibody Screen NEG     Sample Expiration 01/07/2013    ABO/RH      Result Value Range   ABO/RH(D) O POS      Discharge Medications:     Medication List    STOP taking these medications       citalopram 20 MG tablet  Commonly known as:  CELEXA     donepezil 10 MG tablet  Commonly known as:  ARICEPT     entacapone 200 MG tablet  Commonly known as:  COMTAN      TAKE these medications       acetaminophen 500 MG tablet  Commonly known as:  TYLENOL  Take 500 mg by mouth every 6 (six) hours as needed for pain.     bisacodyl 10 MG suppository  Commonly known as:  DULCOLAX  Place 1 suppository (10 mg total) rectally daily as needed.     CARBIDOPA-LEVODOPA PO  Take 1 tablet by mouth daily as needed ((50/100mg  tablet) as needed for "freezing muscles".).     cyanocobalamin 500 MCG tablet  Take 500 mcg by mouth daily.     enoxaparin 40 MG/0.4ML injection  Commonly known as:  LOVENOX  Inject 0.4 mLs (40 mg total) into the skin  daily.     enoxaparin 40 MG/0.4ML injection  Commonly known as:  LOVENOX  Inject 0.4 mLs (40 mg total) into the skin daily.     hydrocodone-ibuprofen 5-200 MG per tablet  Commonly known as:  VICOPROFEN  Take 1 tablet by mouth every 6 (six) hours as needed for pain.        Diagnostic Studies: Dg Hip Complete Right  01/04/2013  *RADIOLOGY REPORT*  Clinical Data: Fall  RIGHT HIP - COMPLETE 2+ VIEW  Comparison: None.  Findings: There is a fracture through the mid right femoral neck with slight displacement and rotation of the femoral head.  There is also slight impaction of the neck into the lateral femoral head. Generative changes at L4-5.  IMPRESSION: Minimally displaced right femoral neck fracture.   Original Report Authenticated By: Jolaine Click, M.D.    Dg Pelvis Portable  01/04/2013  *RADIOLOGY REPORT*  Clinical Data: Postop  PORTABLE PELVIS  Comparison: The film earlier in the day  Findings: Right hip hemiarthroplasty has been performed, with satisfactory position and alignment.  IMPRESSION: As above.   Original Report Authenticated By: Davonna Belling, M.D.     Disposition: 01-Home or Self Care      Discharge Orders   Future Orders Complete By Expires     Weight bearing as tolerated  As directed        Follow-up Information   Follow up with Cadon Raczka,STEVEN R, MD. Call in 2 weeks. [phone])    Contact information:   [address], [address], [address] [place] [address] [phone]        Signed: Verlee Rossetti 01/10/2013, 6:29 AM

## 2013-01-10 NOTE — Progress Notes (Signed)
Patient being discharged.  Report called to [facility].

## 2013-01-10 NOTE — Progress Notes (Signed)
Discharge summary sent to payer through [facility]  

## 2013-10-17 ENCOUNTER — Inpatient Hospital Stay (HOSPITAL_COMMUNITY)
Admission: EM | Admit: 2013-10-17 | Discharge: 2013-10-19 | DRG: 392 | Disposition: A | Discharge status: Skilled Nursing Facility | Payer: Medicare Other | Attending: Internal Medicine | Admitting: Internal Medicine

## 2013-10-17 ENCOUNTER — Emergency Department (HOSPITAL_COMMUNITY): Payer: Medicare Other

## 2013-10-17 ENCOUNTER — Encounter (HOSPITAL_COMMUNITY): Payer: Self-pay | Admitting: Emergency Medicine

## 2013-10-17 DIAGNOSIS — K630 Abscess of intestine: Secondary | ICD-10-CM | POA: Diagnosis present

## 2013-10-17 DIAGNOSIS — I714 Abdominal aortic aneurysm, without rupture, unspecified: Secondary | ICD-10-CM

## 2013-10-17 DIAGNOSIS — E871 Hypo-osmolality and hyponatremia: Secondary | ICD-10-CM

## 2013-10-17 DIAGNOSIS — G309 Alzheimer's disease, unspecified: Secondary | ICD-10-CM | POA: Diagnosis present

## 2013-10-17 DIAGNOSIS — E876 Hypokalemia: Secondary | ICD-10-CM | POA: Diagnosis present

## 2013-10-17 DIAGNOSIS — F329 Major depressive disorder, single episode, unspecified: Secondary | ICD-10-CM | POA: Diagnosis present

## 2013-10-17 DIAGNOSIS — F0390 Unspecified dementia without behavioral disturbance: Secondary | ICD-10-CM

## 2013-10-17 DIAGNOSIS — Z96649 Presence of unspecified artificial hip joint: Secondary | ICD-10-CM

## 2013-10-17 DIAGNOSIS — F3289 Other specified depressive episodes: Secondary | ICD-10-CM | POA: Diagnosis present

## 2013-10-17 DIAGNOSIS — F0280 Dementia in other diseases classified elsewhere without behavioral disturbance: Secondary | ICD-10-CM | POA: Diagnosis present

## 2013-10-17 DIAGNOSIS — R197 Diarrhea, unspecified: Secondary | ICD-10-CM | POA: Diagnosis present

## 2013-10-17 DIAGNOSIS — K5792 Diverticulitis of intestine, part unspecified, without perforation or abscess without bleeding: Secondary | ICD-10-CM | POA: Diagnosis present

## 2013-10-17 DIAGNOSIS — D72829 Elevated white blood cell count, unspecified: Secondary | ICD-10-CM | POA: Diagnosis present

## 2013-10-17 DIAGNOSIS — G20A1 Parkinson's disease without dyskinesia, without mention of fluctuations: Secondary | ICD-10-CM

## 2013-10-17 DIAGNOSIS — K5732 Diverticulitis of large intestine without perforation or abscess without bleeding: Secondary | ICD-10-CM | POA: Diagnosis not present

## 2013-10-17 DIAGNOSIS — R5381 Other malaise: Secondary | ICD-10-CM | POA: Diagnosis present

## 2013-10-17 DIAGNOSIS — R109 Unspecified abdominal pain: Secondary | ICD-10-CM

## 2013-10-17 DIAGNOSIS — G20 Parkinson's disease: Secondary | ICD-10-CM | POA: Diagnosis present

## 2013-10-17 HISTORY — DX: Depression, unspecified: F32.A

## 2013-10-17 HISTORY — DX: Major depressive disorder, single episode, unspecified: F32.9

## 2013-10-17 HISTORY — DX: Dementia in other diseases classified elsewhere, unspecified severity, without behavioral disturbance, psychotic disturbance, mood disturbance, and anxiety: F02.80

## 2013-10-17 HISTORY — DX: Alzheimer's disease, unspecified: G30.9

## 2013-10-17 LAB — URINALYSIS, ROUTINE W REFLEX MICROSCOPIC
Bilirubin Urine: NEGATIVE
Glucose, UA: NEGATIVE mg/dL
Hgb urine dipstick: NEGATIVE
Ketones, ur: NEGATIVE mg/dL
Leukocytes, UA: NEGATIVE
pH: 6 (ref 5.0–8.0)

## 2013-10-17 LAB — COMPREHENSIVE METABOLIC PANEL
ALT: 7 U/L (ref 0–53)
AST: 12 U/L (ref 0–37)
Albumin: 3.8 g/dL (ref 3.5–5.2)
Alkaline Phosphatase: 66 U/L (ref 39–117)
Chloride: 100 mEq/L (ref 96–112)
Potassium: 3.8 mEq/L (ref 3.5–5.1)
Total Bilirubin: 1.2 mg/dL (ref 0.3–1.2)

## 2013-10-17 LAB — CBC
Platelets: 178 10*3/uL (ref 150–400)
RBC: 4.75 MIL/uL (ref 4.22–5.81)
RDW: 11.9 % (ref 11.5–15.5)
WBC: 11.6 10*3/uL — ABNORMAL HIGH (ref 4.0–10.5)

## 2013-10-17 MED ORDER — RASAGILINE MESYLATE 0.5 MG PO TABS
0.5000 mg | ORAL_TABLET | Freq: Every day | ORAL | Status: DC
  Administered 2013-10-18 – 2013-10-19 (×2): 0.5 mg via ORAL
  Filled 2013-10-17 (×2): qty 1

## 2013-10-17 MED ORDER — METRONIDAZOLE IN NACL 5-0.79 MG/ML-% IV SOLN
500.0000 mg | Freq: Three times a day (TID) | INTRAVENOUS | Status: DC
  Administered 2013-10-17 – 2013-10-18 (×2): 500 mg via INTRAVENOUS
  Filled 2013-10-17 (×4): qty 100

## 2013-10-17 MED ORDER — MELOXICAM 7.5 MG PO TABS
7.5000 mg | ORAL_TABLET | Freq: Every day | ORAL | Status: DC
  Administered 2013-10-18 – 2013-10-19 (×2): 7.5 mg via ORAL
  Filled 2013-10-17 (×2): qty 1

## 2013-10-17 MED ORDER — ONDANSETRON HCL 4 MG PO TABS: 4.0000 mg | ORAL_TABLET | Freq: Four times a day (QID) | ORAL | Status: DC | PRN

## 2013-10-17 MED ORDER — CARBIDOPA-LEVODOPA 25-100 MG PO TABS
1.0000 | ORAL_TABLET | Freq: Two times a day (BID) | ORAL | Status: DC
  Administered 2013-10-17 – 2013-10-19 (×4): 1 via ORAL
  Filled 2013-10-17 (×6): qty 1

## 2013-10-17 MED ORDER — MORPHINE SULFATE 4 MG/ML IJ SOLN
4.0000 mg | Freq: Once | INTRAMUSCULAR | Status: AC
  Administered 2013-10-17: 4 mg via INTRAVENOUS
  Filled 2013-10-17: qty 1

## 2013-10-17 MED ORDER — CIPROFLOXACIN IN D5W 400 MG/200ML IV SOLN
400.0000 mg | Freq: Once | INTRAVENOUS | Status: AC
  Administered 2013-10-17: 400 mg via INTRAVENOUS
  Filled 2013-10-17 (×2): qty 200

## 2013-10-17 MED ORDER — CYANOCOBALAMIN 500 MCG PO TABS
500.0000 ug | ORAL_TABLET | Freq: Every day | ORAL | Status: DC
  Administered 2013-10-18 – 2013-10-19 (×2): 500 ug via ORAL
  Filled 2013-10-17 (×3): qty 1

## 2013-10-17 MED ORDER — ENOXAPARIN SODIUM 40 MG/0.4ML ~~LOC~~ SOLN
40.0000 mg | SUBCUTANEOUS | Status: DC
  Administered 2013-10-17 – 2013-10-18 (×2): 40 mg via SUBCUTANEOUS
  Filled 2013-10-17 (×3): qty 0.4

## 2013-10-17 MED ORDER — SODIUM CHLORIDE 0.9 % IV BOLUS (SEPSIS)
1000.0000 mL | Freq: Once | INTRAVENOUS | Status: AC
  Administered 2013-10-17: 1000 mL via INTRAVENOUS

## 2013-10-17 MED ORDER — CARBIDOPA-LEVODOPA 25-100 MG PO TABS
1.5000 | ORAL_TABLET | ORAL | Status: DC
  Administered 2013-10-17 – 2013-10-19 (×11): 1.5 via ORAL
  Filled 2013-10-17 (×13): qty 1.5

## 2013-10-17 MED ORDER — HYDROMORPHONE HCL PF 1 MG/ML IJ SOLN: 0.5000 mg | INTRAMUSCULAR | Status: DC | PRN

## 2013-10-17 MED ORDER — METRONIDAZOLE IN NACL 5-0.79 MG/ML-% IV SOLN
500.0000 mg | Freq: Once | INTRAVENOUS | Status: AC
  Administered 2013-10-17: 500 mg via INTRAVENOUS
  Filled 2013-10-17: qty 100

## 2013-10-17 MED ORDER — CARBIDOPA-LEVODOPA ER 25-100 MG PO TBCR: 1.0000 | EXTENDED_RELEASE_TABLET | ORAL | Status: DC | PRN

## 2013-10-17 MED ORDER — CARBIDOPA-LEVODOPA 25-100 MG PO TABS
2.0000 | ORAL_TABLET | ORAL | Status: DC
  Administered 2013-10-18 – 2013-10-19 (×2): 2 via ORAL
  Filled 2013-10-17 (×3): qty 2

## 2013-10-17 MED ORDER — FOLIC ACID 1 MG PO TABS
1.0000 mg | ORAL_TABLET | Freq: Every day | ORAL | Status: DC
  Administered 2013-10-18 – 2013-10-19 (×2): 1 mg via ORAL
  Filled 2013-10-17 (×2): qty 1

## 2013-10-17 MED ORDER — CIPROFLOXACIN IN D5W 400 MG/200ML IV SOLN
400.0000 mg | Freq: Two times a day (BID) | INTRAVENOUS | Status: DC
  Administered 2013-10-17 – 2013-10-18 (×2): 400 mg via INTRAVENOUS
  Filled 2013-10-17 (×3): qty 200

## 2013-10-17 MED ORDER — SODIUM CHLORIDE 0.9 % IV SOLN
INTRAVENOUS | Status: DC
  Administered 2013-10-17: 19:00:00 via INTRAVENOUS

## 2013-10-17 MED ORDER — VITAMIN D3 25 MCG (1000 UNIT) PO TABS
1000.0000 [IU] | ORAL_TABLET | Freq: Every day | ORAL | Status: DC
  Administered 2013-10-18 – 2013-10-19 (×2): 1000 [IU] via ORAL
  Filled 2013-10-17 (×2): qty 1

## 2013-10-17 MED ORDER — ENTACAPONE 200 MG PO TABS
200.0000 mg | ORAL_TABLET | Freq: Every day | ORAL | Status: DC
  Administered 2013-10-17 – 2013-10-19 (×10): 200 mg via ORAL
  Filled 2013-10-17 (×13): qty 1

## 2013-10-17 MED ORDER — HYDROCODONE-IBUPROFEN 5-200 MG PO TABS: 1.0000 | ORAL_TABLET | Freq: Four times a day (QID) | ORAL | Status: DC | PRN

## 2013-10-17 MED ORDER — IOHEXOL 300 MG/ML  SOLN
100.0000 mL | Freq: Once | INTRAMUSCULAR | Status: AC | PRN
  Administered 2013-10-17: 100 mL via INTRAVENOUS

## 2013-10-17 MED ORDER — CARBIDOPA-LEVODOPA ER 25-100 MG PO TBCR: 1.0000 | EXTENDED_RELEASE_TABLET | ORAL | Status: DC

## 2013-10-17 MED ORDER — DONEPEZIL HCL 10 MG PO TABS
10.0000 mg | ORAL_TABLET | Freq: Every day | ORAL | Status: DC
Start: 2013-10-17 — End: 2013-10-19
  Administered 2013-10-17 – 2013-10-18 (×2): 10 mg via ORAL
  Filled 2013-10-17 (×3): qty 1

## 2013-10-17 MED ORDER — ADULT MULTIVITAMIN W/MINERALS CH
1.0000 | ORAL_TABLET | Freq: Every day | ORAL | Status: DC
  Administered 2013-10-18 – 2013-10-19 (×2): 1 via ORAL
  Filled 2013-10-17 (×2): qty 1

## 2013-10-17 MED ORDER — ONDANSETRON HCL 4 MG/2ML IJ SOLN: 4.0000 mg | Freq: Four times a day (QID) | INTRAMUSCULAR | Status: DC | PRN

## 2013-10-17 NOTE — ED Notes (Signed)
Patient transported to CT 

## 2013-10-17 NOTE — ED Notes (Signed)
Pt has discoloration to buttock area.

## 2013-10-17 NOTE — ED Provider Notes (Signed)
CSN: [id number]     Arrival date & time 10/17/13  1047 History   First MD Initiated Contact with Patient 10/17/13 1049     Chief Complaint  Patient presents with  . Abdominal Pain   (Consider location/radiation/quality/duration/timing/severity/associated sxs/prior Treatment) HPI Comments: Abdominal pain intermittent for past several months. Would have for a few days at a time, then it would disappear for several days. Worse today, acute onset while walking, lower abdominal pain.   Patient is a 57 y.o. male presenting with abdominal pain. The history is provided by the patient.  Abdominal Pain Pain location:  Suprapubic and LLQ Pain quality: sharp   Pain radiates to:  Does not radiate Pain severity:  Moderate Onset quality:  Sudden Timing:  Constant Progression:  Worsening Chronicity:  Recurrent Context: not diet changes, not eating, not previous surgeries, not recent illness, not sick contacts and not trauma   Relieved by:  Nothing Worsened by:  Nothing tried Associated symptoms: no cough, no diarrhea, no fever, no nausea, no shortness of breath and no vomiting     Past Medical History  Diagnosis Date  . Parkinson disease   . Depression   . Alzheimer's dementia    Past Surgical History  Procedure Laterality Date  . Cervical fusion    . Hip arthroplasty Right 01/04/2013    Procedure: ARTHROPLASTY BIPOLAR HIP;  Surgeon: Verlee Rossetti, MD;  Location: [facility];  Service: Orthopedics;  Laterality: Right;   History reviewed. No pertinent family history. History  Substance Use Topics  . Smoking status: Never Smoker   . Smokeless tobacco: Never Used  . Alcohol Use: No    Review of Systems  Constitutional: Negative for fever.  Respiratory: Negative for cough and shortness of breath.   Gastrointestinal: Positive for abdominal pain. Negative for nausea, vomiting, diarrhea and blood in stool.  All other systems reviewed and are negative.    Allergies  Review of patient's  allergies indicates no known allergies.  Home Medications   Current Outpatient Rx  Name  Route  Sig  Dispense  Refill  . acetaminophen (TYLENOL) 500 MG tablet   Oral   Take 500 mg by mouth every 6 (six) hours as needed for pain.         . bisacodyl (DULCOLAX) 10 MG suppository   Rectal   Place 1 suppository (10 mg total) rectally daily as needed.   20 suppository   0   . CARBIDOPA-LEVODOPA PO   Oral   Take 1 tablet by mouth daily as needed ((50/100mg  tablet) as needed for "freezing muscles".).         [name] cyanocobalamin 500 MCG tablet   Oral   Take 500 mcg by mouth daily.         [name] enoxaparin (LOVENOX) 40 MG/0.4ML injection   Subcutaneous   Inject 0.4 mLs (40 mg total) into the skin daily.   40 mL   0     30 days post surgery, supply sufficient please, pe ...   . EXPIRED: enoxaparin (LOVENOX) 40 MG/0.4ML injection   Subcutaneous   Inject 0.4 mLs (40 mg total) into the skin daily.   40 mL   0     Per pharmacy for 30 days post op   . hydrocodone-ibuprofen (VICOPROFEN) 5-200 MG per tablet   Oral   Take 1 tablet by mouth every 6 (six) hours as needed for pain.          BP 135/86  Pulse 93  Temp(Src)  97.6 F (36.4 C) (Oral)  Resp 16  SpO2 93% Physical Exam  Nursing note and vitals reviewed. Constitutional: He is oriented to person, place, and time. He appears well-developed and well-nourished. No distress.  HENT:  Head: Normocephalic and atraumatic.  Mouth/Throat: No oropharyngeal exudate.  Eyes: EOM are normal. Pupils are equal, round, and reactive to light.  Neck: Normal range of motion. Neck supple.  Cardiovascular: Normal rate and regular rhythm.  Exam reveals no friction rub.   No murmur heard. Pulmonary/Chest: Effort normal and breath sounds normal. No respiratory distress. He has no wheezes. He has no rales.  Abdominal: He exhibits no distension. There is tenderness (suprapubic, LLQ). There is no rebound.  Genitourinary: Right testis shows no  mass, no swelling and no tenderness. Right testis is descended. Left testis shows no mass, no swelling and no tenderness. Left testis is descended.  Musculoskeletal: Normal range of motion. He exhibits no edema.  Neurological: He is alert and oriented to person, place, and time.  Skin: He is not diaphoretic.    ED Course  Procedures (including critical care time) Labs Review Labs Reviewed  CBC  COMPREHENSIVE METABOLIC PANEL  LIPASE, BLOOD  URINALYSIS, ROUTINE W REFLEX MICROSCOPIC   Imaging Review No results found.  EKG Interpretation   None       MDM   1. Diverticulitis   2. Pericolonic abscess   3. Abdominal pain    38M with hx of Parkinson's presents with abdominal pain. Intermittent over past several months, acute onset this morning. No vomiting, diarrhea, blood in stool. Patient here with stable vitals. Mild tremor, baseline per his Parkinson's. Patient has suprapubic, LLQ pain. No testicular pain on exam. Will CT and check labs. CT shows diverticulitis, complicated by likely abscess. Patient is being become more confused with some persecutory ideas about staff. Patient admitted for further observation. Given Cipro and Flagyl here in the ED.    Dagmar Hait, MD 10/17/13 1535

## 2013-10-17 NOTE — H&P (Addendum)
[facility] History and Physical  Keyler Hoge WGN:[id number] DOB: 18-Oct-1956 DOA: 10/17/2013  Referring physician: ED physician PCP: Clance Boll, MD   Chief Complaint: abdominal pain and diarrhea   HPI:  Pt is 57 yo male with Parkinson's disease and AAA, who presented to [facility] from SNF with main concern of progressively worsening abdominal pain located in the LLQ and suprapubic area, constant and throbbing, 7/10 in severity, non radiating and with no specific alleviating or aggravating factors, no similar events in the past. Pt also reports associated malaise and poor oral intake. He denies chest pain or shortness of breath, no specific focal neurological symptoms and explains his Parkinson's symptoms have been relatively stable.   In ED, pt found to be hemodynamically stable, CT abd/pelvis worrisome for acute diverticulitis and ? Development of small diverticular abscess. [facility] asked to admit for further evaluation and management.   Radiological Exams on Admission: Ct Abdomen Pelvis W Contrast  10/17/2013    1. Severely inflamed 6 cm segment of proximal sigmoid colon with an appearance most compatible with acute diverticulitis.  2. Evidence of a developing small diverticular abscess measuring 28 mm located adjacent to the left external iliac neurovascular bundle.  3. Infrarenal AAA measuring up to 51 mm diameter.  4. Small nonspecific 18 mm hypodense focus in liver adjacent to hepatic IVC, superimposed on suspected multiple small hepatic cysts.    Assessment and Plan: Active Problems:   Diverticulitis - agree with admission to medical floor - place on Cipro and Flagyl - provide supportive care with IVF, analgesia and antiemetics as needed  - advance diet as pt able to tolerate   AAA - pt aware of the diagnosis - follow up by abdomen and pelvis CTA in 3-6 months as recommended - may need vascular surgery consultation while inpatient    Parkinson's - appears to be at  baseline - continue home medical regimen   Code Status: Full Family Communication: Pt at bedside Disposition Plan: Admitted to medical floor   Review of Systems:  Constitutional: Negative for diaphoresis.  HENT: Negative for hearing loss, ear pain, nosebleeds, congestion, sore throat, neck pain, tinnitus and ear discharge.   Eyes: Negative for blurred vision, double vision, photophobia, pain, discharge and redness.  Respiratory: Negative for cough, hemoptysis, sputum production, shortness of breath, wheezing and stridor.   Cardiovascular: Negative for chest pain, palpitations, orthopnea, claudication and leg swelling.  Gastrointestinal: Negative for heartburn, constipation, blood in stool and melena.  Genitourinary: Negative for dysuria, urgency, frequency, hematuria and flank pain.  Musculoskeletal: Negative for myalgias, back pain.  Skin: Negative for itching and rash.  Neurological: Negative for tingling, tremors, sensory change, speech change, focal weakness, loss of consciousness and headaches.  Endo/Heme/Allergies: Negative for environmental allergies and polydipsia. Does not bruise/bleed easily.  Psychiatric/Behavioral: Negative for suicidal ideas. The patient is not nervous/anxious.      Past Medical History  Diagnosis Date  . Parkinson disease   . Depression   . Alzheimer's dementia     Past Surgical History  Procedure Laterality Date  . Cervical fusion    . Hip arthroplasty Right 01/04/2013    Procedure: ARTHROPLASTY BIPOLAR HIP;  Surgeon: Verlee Rossetti, MD;  Location: [facility];  Service: Orthopedics;  Laterality: Right;    Social History:  reports that he has never smoked. He has never used smokeless tobacco. He reports that he does not drink alcohol or use illicit drugs.  No Known Allergies  No family medical history.  Prior to Admission medications   Medication Sig Start Date End Date Taking? Authorizing Provider  acetaminophen (TYLENOL) 500 MG tablet Take 500  mg by mouth every 6 (six) hours as needed for pain.   Yes Historical Provider, MD  alendronate (FOSAMAX) 70 MG tablet Take 70 mg by mouth once a week. Take with a full glass of water on an empty stomach.   Yes Historical Provider, MD  Carbidopa-Levodopa ER (SINEMET CR) 25-100 MG tablet controlled release Take 1-2 tablets by mouth See admin instructions. 6a take 2 tabs 8am take 1 tab 10am/12n/2pm/4pm/6pm take 1.5 tabs 8p take 1 tab 10p take 1.5 tabs   Yes Historical Provider, MD  Carbidopa-Levodopa ER (SINEMET CR) 25-100 MG tablet controlled release Take 1 tablet by mouth as needed (1x a day if complaint of freezing muscles).   Yes Historical Provider, MD  cholecalciferol (VITAMIN D) 1000 UNITS tablet Take 1,000 Units by mouth daily.   Yes Historical Provider, MD  cyanocobalamin 500 MCG tablet Take 500 mcg by mouth daily at 12 noon.    Yes Historical Provider, MD  donepezil (ARICEPT) 10 MG tablet Take 10 mg by mouth at bedtime.   Yes Historical Provider, MD  entacapone (COMTAN) 200 MG tablet Take 200 mg by mouth 5 (five) times daily. 6a/10a/2p/6p/10p   Yes Historical Provider, MD  folic acid (FOLVITE) 1 MG tablet Take 1 mg by mouth daily at 12 noon.    Yes Historical Provider, MD  hydrocodone-ibuprofen (VICOPROFEN) 5-200 MG per tablet Take 1 tablet by mouth every 6 (six) hours as needed for pain.   Yes Historical Provider, MD  meloxicam (MOBIC) 7.5 MG tablet Take 7.5 mg by mouth daily at 12 noon.   Yes Historical Provider, MD  Multiple Vitamin (MULTIVITAMIN WITH MINERALS) TABS tablet Take 1 tablet by mouth daily.   Yes Historical Provider, MD  rasagiline (AZILECT) 0.5 MG TABS tablet Take 0.5 mg by mouth daily.   Yes Historical Provider, MD    Physical Exam: Filed Vitals:   10/17/13 1051 10/17/13 1646  BP: 135/86 128/86  Pulse: 93 96  Temp: 97.6 F (36.4 C) 97.7 F (36.5 C)  TempSrc: Oral Oral  Resp: 16 18  Weight:  104.7 kg (230 lb 13.2 oz)  SpO2: 93% 96%    Physical Exam   Constitutional: Appears well-developed and well-nourished. No distress.  HENT: Normocephalic. External right and left ear normal. Dry MM Eyes: Conjunctivae and EOM are normal. PERRLA, no scleral icterus.  Neck: Normal ROM. Neck supple. No JVD. No tracheal deviation. No thyromegaly.  CVS: RRR, S1/S2 +, no murmurs, no gallops, no carotid bruit.  Pulmonary: Effort and breath sounds normal, no stridor, rhonchi, wheezes, rales.  Abdominal: Soft. BS +,  no distension, tenderness in lower abdominal quadrants, no rebound or guarding.  Musculoskeletal: Normal range of motion. No edema and no tenderness.  Lymphadenopathy: No lymphadenopathy noted, cervical, inguinal. Neuro: Alert. Increased muscle rigidity and tone. Tremor present  Skin: Skin is warm and dry. No rash noted. Not diaphoretic. No erythema. No pallor.  Psychiatric: Normal mood and affect. Behavior, judgment, thought content normal.   Labs on Admission:  Basic Metabolic Panel:  Recent Labs Lab 10/17/13 1135  NA 136  K 3.8  CL 100  CO2 25  GLUCOSE 124*  BUN 19  CREATININE 0.82  CALCIUM 9.4   Liver Function Tests:  Recent Labs Lab 10/17/13 1135  AST 12  ALT 7  ALKPHOS 66  BILITOT 1.2  PROT 7.0  ALBUMIN 3.8  Recent Labs Lab 10/17/13 1135  LIPASE 26   CBC:  Recent Labs Lab 10/17/13 1135  WBC 11.6*  HGB 15.1  HCT 42.1  MCV 88.6  PLT 178   EKG: Normal sinus rhythm, no ST/T wave changes  Debbora Presto, MD  [facility] Pager [phone]  If 7PM-7AM, please contact night-coverage [email or web address] Password [facility] 10/17/2013, 5:35 PM

## 2013-10-17 NOTE — ED Notes (Signed)
Bed: WA14 Expected date:  Expected time:  Means of arrival:  Comments: ems 

## 2013-10-17 NOTE — Progress Notes (Signed)
Utilization Review completed.  Shaleigh Laubscher RN CM  

## 2013-10-17 NOTE — ED Notes (Signed)
Pt from [facility], hx Parkinson and dementia. Per EMS. Pt states he felt fine this am, on way back from physical therapy after breakfast he had a sudden onset of generalized abd pain worse in lower quadrants. Denies n/v/d, LBM yesterday, urinated today with no difficulty. Staff at nursing home noted pt was more confused and anxious than usual.

## 2013-10-18 LAB — BASIC METABOLIC PANEL
BUN: 12 mg/dL (ref 6–23)
CO2: 27 mEq/L (ref 19–32)
Calcium: 8.7 mg/dL (ref 8.4–10.5)
Creatinine, Ser: 0.8 mg/dL (ref 0.50–1.35)
GFR calc Af Amer: >90 mL/min (ref >90)
Glucose, Bld: 138 mg/dL — ABNORMAL HIGH (ref 70–99)

## 2013-10-18 LAB — CBC
MCH: 31.3 pg (ref 26.0–34.0)
MCV: 89.5 fL (ref 78.0–100.0)
Platelets: 159 10*3/uL (ref 150–400)
RBC: 4.57 MIL/uL (ref 4.22–5.81)
RDW: 11.9 % (ref 11.5–15.5)

## 2013-10-18 LAB — MRSA PCR SCREENING: MRSA by PCR: NEGATIVE

## 2013-10-18 MED ORDER — AMPICILLIN-SULBACTAM SODIUM 3 (2-1) G IJ SOLR
3.0000 g | Freq: Four times a day (QID) | INTRAMUSCULAR | Status: DC
  Administered 2013-10-18 – 2013-10-19 (×4): 3 g via INTRAVENOUS
  Filled 2013-10-18 (×5): qty 3

## 2013-10-18 MED ORDER — LORAZEPAM 2 MG/ML IJ SOLN: 1.0000 mg | Freq: Four times a day (QID) | INTRAMUSCULAR | Status: DC | PRN

## 2013-10-18 MED ORDER — POTASSIUM CHLORIDE CRYS ER 20 MEQ PO TBCR
40.0000 meq | EXTENDED_RELEASE_TABLET | Freq: Once | ORAL | Status: AC
  Administered 2013-10-18: 40 meq via ORAL
  Filled 2013-10-18: qty 2

## 2013-10-18 NOTE — Progress Notes (Signed)
Patient ID: Larry Daniel, male   DOB: 06/22/1956, 57 y.o.   MRN: [id number]  [facility] PROGRESS NOTE  Larry Daniel HQI:[id number] DOB: 1956/05/31 DOA: 10/17/2013 PCP: Clance Boll, MD  Brief narrative: Pt is 57 yo male with Parkinson's disease and AAA, who presented to [facility] from SNF with main concern of progressively worsening abdominal pain located in the LLQ and suprapubic area, constant and throbbing, 7/10 in severity, non radiating and with no specific alleviating or aggravating factors, no similar events in the past. Pt also reports associated malaise and poor oral intake. He denies chest pain or shortness of breath, no specific focal neurological symptoms and explains his Parkinson's symptoms have been relatively stable.   In ED, pt found to be hemodynamically stable, CT abd/pelvis worrisome for acute diverticulitis and ? Development of small diverticular abscess. [facility] asked to admit for further evaluation and management.   Assessment and Plan:  Active Problems:  Diverticulitis  - placed on Cipro and Flagyl, pharmacy recommended changing due Unasyn due to drug interaction with Azilect  - provide supportive care with IVF, analgesia and antiemetics as needed  - advance diet as pt able to tolerate  Confusion - noted to be rather confused this AM and his baseline mental status is not clear - spoke with son over the phone Larry Daniel and he explains pt has advanced dementia and appears to be his baseline - ativan as needed for agitation  AAA  - follow up by abdomen and pelvis CTA in 3-6 months as recommended  - may need vascular surgery consultation while inpatient but per previous reports pt not surgical candidate due to advancing Parkinson's  Parkinson's  - appears to be at baseline  - continue home medical regimen  Hypokalemia - mild, will supplement - repeat BMP in AM  Consultants:  None  Procedures/Studies: Radiological Exams on Admission:  Ct Abdomen Pelvis W  Contrast 10/17/2013  1. Severely inflamed 6 cm segment of proximal sigmoid colon with an appearance most compatible with acute diverticulitis.  2. Evidence of a developing small diverticular abscess measuring 28 mm located adjacent to the left external iliac neurovascular bundle.  3. Infrarenal AAA measuring up to 51 mm diameter.  4. Small nonspecific 18 mm hypodense focus in liver adjacent to hepatic IVC, superimposed on suspected multiple small hepatic cysts.   Antibiotics:  Ciprofloxacin 12/29 --> 12/30  Flagyl 12/29 --> 12/30  Unasyn 12/30 -->  Code Status: Full Family Communication: Pt at bedside Disposition Plan: SNF when stable   HPI/Subjective: No events overnight.   Objective: Filed Vitals:   10/17/13 1646 10/17/13 2023 10/17/13 2131 10/18/13 0421  BP: 128/86  136/82 145/88  Pulse: 96  98 99  Temp: 97.7 F (36.5 C)  98.2 F (36.8 C) 97.8 F (36.6 C)  TempSrc: Oral  Oral Oral  Resp: 18  16 18   Height:  6\' 7"  (2.007 m)    Weight: 104.7 kg (230 lb 13.2 oz)     SpO2: 96%  96% 98%    Intake/Output Summary (Last 24 hours) at 10/18/13 1022 Last data filed at 10/18/13 0657  Gross per 24 hour  Intake      0 ml  Output   1300 ml  Net  -1300 ml    Exam:   General:  Pt is alert, confused, not following commands appropriately   Cardiovascular: Regular rate and rhythm, S1/S2, no murmurs, no rubs, no gallops  Respiratory: Clear to auscultation bilaterally, no wheezing, no crackles, no rhonchi  Abdomen: Soft, non tender, non distended, bowel sounds present, no guarding  Extremities: No edema, pulses DP and PT palpable bilaterally  Data Reviewed: Basic Metabolic Panel:  Recent Labs Lab 10/17/13 1135 10/18/13 0429  NA 136 137  K 3.8 3.4*  CL 100 99  CO2 25 27  GLUCOSE 124* 138*  BUN 19 12  CREATININE 0.82 0.80  CALCIUM 9.4 8.7   Liver Function Tests:  Recent Labs Lab 10/17/13 1135  AST 12  ALT 7  ALKPHOS 66  BILITOT 1.2  PROT 7.0  ALBUMIN 3.8     Recent Labs Lab 10/17/13 1135  LIPASE 26   CBC:  Recent Labs Lab 10/17/13 1135 10/18/13 0429  WBC 11.6* 11.0*  HGB 15.1 14.3  HCT 42.1 40.9  MCV 88.6 89.5  PLT 178 159   Recent Results (from the past 240 hour(s))  MRSA PCR SCREENING     Status: None   Collection Time    10/17/13 10:54 PM      Result Value Range Status   MRSA by PCR NEGATIVE  NEGATIVE Final   Comment:            The GeneXpert MRSA Assay (FDA     approved for NASAL specimens     only), is one component of a     comprehensive MRSA colonization     surveillance program. It is not     intended to diagnose MRSA     infection nor to guide or     monitor treatment for     MRSA infections.     Scheduled Meds: . carbidopa-levodopa  1 tablet Oral BID  . carbidopa-levodopa  1.5 tablet Oral Custom  . carbidopa-levodopa  2 tablet Oral Q24H  . cholecalciferol  1,000 Units Oral Daily  . ciprofloxacin  400 mg Intravenous Q12H  . cyanocobalamin  500 mcg Oral Q1200  . donepezil  10 mg Oral QHS  . enoxaparin (LOVENOX) injection  40 mg Subcutaneous Q24H  . entacapone  200 mg Oral 5 X Daily  . folic acid  1 mg Oral Q1200  . meloxicam  7.5 mg Oral Q1200  . metronidazole  500 mg Intravenous Q8H  . multivitamin with minerals  1 tablet Oral Daily  . rasagiline  0.5 mg Oral Daily   Continuous Infusions: . sodium chloride 50 mL/hr at 10/17/13 9562   Larry Presto, MD  [facility] Pager [phone]  If 7PM-7AM, please contact night-coverage [email or web address] Password [email or web address] 10/18/2013, 10:22 AM   LOS: 1 day

## 2013-10-18 NOTE — Progress Notes (Signed)
Nutrition Education Note  RD drawn to chart secondary to current prescription of Azilect, a medication with potential interactions with high tyramine-containing foods.  Hospitalized diet is not high in tyramine and poses no risk for patient at this time. Provided patient with "Tyramine Restricted Nutrition Therapy" handout to assist with complying with restrictions upon discharge. Pt verbalized understanding, does not normally consume high tyramine containing foods. Pt resident of SNF, who will also assist in pt's compliant with potential drug/nutrient interactions  No nutrition interventions warranted at this time. RD contact information provided. If additional nutrition issues arise or education warranted, please re-consult RD.  Lloyd Huger MS RD LDN Clinical Dietitian Pager:[phone]

## 2013-10-19 ENCOUNTER — Encounter: Payer: Self-pay | Admitting: Nurse Practitioner

## 2013-10-19 DIAGNOSIS — E876 Hypokalemia: Secondary | ICD-10-CM

## 2013-10-19 DIAGNOSIS — D72829 Elevated white blood cell count, unspecified: Secondary | ICD-10-CM

## 2013-10-19 DIAGNOSIS — F0390 Unspecified dementia without behavioral disturbance: Secondary | ICD-10-CM

## 2013-10-19 DIAGNOSIS — I714 Abdominal aortic aneurysm, without rupture: Secondary | ICD-10-CM

## 2013-10-19 LAB — CBC
HCT: 41.5 % (ref 39.0–52.0)
Hemoglobin: 14.3 g/dL (ref 13.0–17.0)
Platelets: 176 10*3/uL (ref 150–400)
RBC: 4.58 MIL/uL (ref 4.22–5.81)
RDW: 12 % (ref 11.5–15.5)
WBC: 6.2 10*3/uL (ref 4.0–10.5)

## 2013-10-19 LAB — BASIC METABOLIC PANEL
Calcium: 8.9 mg/dL (ref 8.4–10.5)
Chloride: 101 mEq/L (ref 96–112)
Creatinine, Ser: 0.74 mg/dL (ref 0.50–1.35)
GFR calc Af Amer: >90 mL/min (ref >90)
GFR calc non Af Amer: >90 mL/min (ref >90)
Sodium: 138 mEq/L (ref 137–147)

## 2013-10-19 MED ORDER — HYDROCODONE-IBUPROFEN 5-200 MG PO TABS: 1.0000 | ORAL_TABLET | Freq: Four times a day (QID) | ORAL | Status: DC | PRN

## 2013-10-19 MED ORDER — AMOXICILLIN-POT CLAVULANATE 875-125 MG PO TABS: 1.0000 | ORAL_TABLET | Freq: Two times a day (BID) | ORAL | Status: DC

## 2013-10-19 MED ORDER — AMOXICILLIN-POT CLAVULANATE 875-125 MG PO TABS
1.0000 | ORAL_TABLET | Freq: Two times a day (BID) | ORAL | Status: DC
  Administered 2013-10-19: 1 via ORAL
  Filled 2013-10-19 (×2): qty 1

## 2013-10-19 NOTE — Discharge Summary (Addendum)
Physician Discharge Summary  Larry Daniel WUJ:[id number] DOB: 05/02/1956 DOA: 10/17/2013  PCP: Clance Boll, MD  Admit date: 10/17/2013 Discharge date: 10/19/2013  Recommendations for Outpatient Follow-up:  1.  Continue antibiotics for 14 days through 10/29/2012, then stop.   2.  Will need follow up with gastroenterology after completion of antibiotics for reevaluation for abscess and colonoscopy. 3.  Ordered home health PT/OT/RN  4.  Vascular surgery consultation if felt indicated  Discharge Diagnoses:  Principal Problem:   Diverticulitis Active Problems:   Parkinson disease   Dementia   AAA (abdominal aortic aneurysm) without rupture   Leukocytosis, unspecified   Hypokalemia   Discharge Condition: stable, improved  Diet recommendation:  Low tyramine.  Low residue diet until abdominal pain resolved, then start high fiber.    Wt Readings from Last 3 Encounters:  10/17/13 104.7 kg (230 lb 13.2 oz)  01/04/13 98.3 kg (216 lb 11.4 oz)  01/04/13 98.3 kg (216 lb 11.4 oz)    History of present illness:   Pt is 57 yo male with Parkinson's disease and AAA, who presented to [facility] from SNF with main concern of progressively worsening abdominal pain located in the LLQ and suprapubic area, constant and throbbing, 7/10 in severity, non radiating and with no specific alleviating or aggravating factors, no similar events in the past. Pt also reports associated malaise and poor oral intake. He denies chest pain or shortness of breath, no specific focal neurological symptoms and explains his Parkinson's symptoms have been relatively stable.   In ED, pt found to be hemodynamically stable, CT abd/pelvis worrisome for acute diverticulitis and ? Development of small diverticular abscess. TR[facility] asked to admit for further evaluation and management.   Hospital Course:   Diverticulitis, initially placed on Cipro and Flagyl, pharmacy recommended changing due Unasyn due to drug interaction with  Azilect.  He was converted to Augmentin to complete a 14-day course of antibiotics.  He should follow up with Gastroenterology after completion of his antibiotics for reevaluation for abscess and possible colonoscopy.  He was eating and drinking well and abdominal pain improved prior to discharge.      Confusion, persistently confused in the setting of advanced dementia.     AAA 5.1cm.  Follow up by abdomen and pelvis CTA in 3-6 months as recommended.  Defer vascular surgery consultation while inpatient but per previous reports pt not surgical candidate due to advancing Parkinson's.  Possible 18mm low-density hepatic lesion near IVC.  Repeat CT scan in 3-6 months.    Parkinson's stable.  Continued home regimen.    Hypokalemia mild and likely due to poor oral intake from diverticulitis.  Resolved with supplementation.    Leukocytosis, resolved with antibiotics.    Ct Abdomen Pelvis W Contrast 10/17/2013  1. Severely inflamed 6 cm segment of proximal sigmoid colon with an appearance most compatible with acute diverticulitis.  2. Evidence of a developing small diverticular abscess measuring 28 mm located adjacent to the left external iliac neurovascular bundle.  3. Infrarenal AAA measuring up to 51 mm diameter.  4. Small nonspecific 18 mm hypodense focus in liver adjacent to hepatic IVC, superimposed on suspected multiple small hepatic cysts.  Antibiotics:  Ciprofloxacin 12/29 --> 12/30  Flagyl 12/29 --> 12/30  Unasyn 12/30 -->   Discharge Exam: Filed Vitals:   10/19/13 0657  BP: 133/92  Pulse: 79  Temp: 98 F (36.7 C)  Resp: 16   Filed Vitals:   10/18/13 0421 10/18/13 1400 10/18/13 2100 10/19/13 9562  BP: 145/88 130/80 101/65 133/92  Pulse: 99 92 81 79  Temp: 97.8 F (36.6 C) 98 F (36.7 C) 97.9 F (36.6 C) 98 F (36.7 C)  TempSrc: Oral Oral Oral Oral  Resp: 18 18 17 16   Height:      Weight:      SpO2: 98% 98% 94% 99%    General: CM, NAD HEENT:  MMM,  NCAT Cardiovascular: RRR, no mrg, 2+ pulses Respiratory: CTAB ABD:  NABS, soft, ND, mild TTP in the epigastrium and LLQ without rebound or guarding MSK:  No LEE  Discharge Instructions      Discharge Orders   Future Appointments Provider Department Dept Phone   11/02/2013 11:00 AM Meredith Pel, NP [facility] [phone]   Future Orders Complete By Expires   Call MD for:  difficulty breathing, headache or visual disturbances  As directed    Call MD for:  extreme fatigue  As directed    Call MD for:  hives  As directed    Call MD for:  persistant dizziness or light-headedness  As directed    Call MD for:  persistant nausea and vomiting  As directed    Call MD for:  severe uncontrolled pain  As directed    Call MD for:  temperature >100.4  As directed    Diet general  As directed    Comments:     Low tyramine indefinitely.  Low residue until abdominal pain resolved, then start high fiber   Discharge instructions  As directed    Comments:     Continue antibiotics and follow up with gastroenterology at already scheduled appointment.   Increase activity slowly  As directed        Medication List         acetaminophen 500 MG tablet  Commonly known as:  TYLENOL  Take 500 mg by mouth every 6 (six) hours as needed for pain.     alendronate 70 MG tablet  Commonly known as:  FOSAMAX  Take 70 mg by mouth once a week. Take with a full glass of water on an empty stomach.     amoxicillin-clavulanate 875-125 MG per tablet  Commonly known as:  AUGMENTIN  Take 1 tablet by mouth every 12 (twelve) hours.     cholecalciferol 1000 UNITS tablet  Commonly known as:  VITAMIN D  Take 1,000 Units by mouth daily.     cyanocobalamin 500 MCG tablet  Take 500 mcg by mouth daily at 12 noon.     donepezil 10 MG tablet  Commonly known as:  ARICEPT  Take 10 mg by mouth at bedtime.     entacapone 200 MG tablet  Commonly known as:  COMTAN  Take 200 mg by mouth 5  (five) times daily. 6a/10a/2p/6p/10p     folic acid 1 MG tablet  Commonly known as:  FOLVITE  Take 1 mg by mouth daily at 12 noon.     hydrocodone-ibuprofen 5-200 MG per tablet  Commonly known as:  VICOPROFEN  Take 1 tablet by mouth every 6 (six) hours as needed for pain.     meloxicam 7.5 MG tablet  Commonly known as:  MOBIC  Take 7.5 mg by mouth daily at 12 noon.     multivitamin with minerals Tabs tablet  Take 1 tablet by mouth daily.     rasagiline 0.5 MG Tabs tablet  Commonly known as:  AZILECT  Take 0.5 mg by mouth daily.     SINEMET  CR 25-100 MG tablet controlled release  Generic drug:  Carbidopa-Levodopa ER  - Take 1-2 tablets by mouth See admin instructions. 6a take 2 tabs  - 8am take 1 tab  - 10am/12n/2pm/4pm/6pm take 1.5 tabs  - 8p take 1 tab  - 10p take 1.5 tabs     SINEMET CR 25-100 MG tablet controlled release  Generic drug:  Carbidopa-Levodopa ER  Take 1 tablet by mouth as needed (1x a day if complaint of freezing muscles).       Follow-up Information   Follow up with BRYANT III, [name], MD. Schedule an appointment as soon as possible for a visit in 2 weeks.   Specialty:  Neurology   Contact information:   [address] [place] A[place] [address] [phone]       Follow up with Willette Cluster, NP On 11/02/2013. (at 11am)    Specialty:  Nurse Practitioner   Contact information:   [address] [place] [address] [phone]        The results of significant diagnostics from this hospitalization (including imaging, microbiology, ancillary and laboratory) are listed below for reference.    Significant Diagnostic Studies: Ct Abdomen Pelvis W Contrast  10/17/2013   CLINICAL DATA:  57 year old male with left lower quadrant pain, suprapubic pain. Initial encounter.  EXAM: CT ABDOMEN AND PELVIS WITH CONTRAST  TECHNIQUE: Multidetector CT imaging of the abdomen and pelvis was performed using the standard protocol following bolus administration  of intravenous contrast.  CONTRAST:  OMNIPAQUE IOHEXOL 300 MG/ML  SOLN  COMPARISON:  Pelvis radiograph 01/04/2013.  FINDINGS: No pericardial or pleural effusion. Dependent pulmonary atelectasis.  Degenerative changes in the spine. Right hip arthroplasty. No acute osseous abnormality identified.  No pelvic free fluid.  Negative rectum.  Confluent inflammation about the proximal sigmoid colon in the left lower quadrant (series 2, image 74). Diverticulosis in the region. Mesenteric inflammatory stranding and evidence of an organizing 26 mm fluid collection adjacent to the left external iliac neurovascular bundle (coronal image 32 and series 2, image 82) measuring 26-28 mm diameter. Trace free fluid or fashioned thickening in the left gutter. No extraluminal gas or pneumoperitoneum identified.  Diverticulosis continues in the left colon to the splenic flexure without additional inflammation. Negative transverse colon, right colon and small bowel. Small fact containing umbilical hernia. Small gastric hiatal hernia. Otherwise negative stomach. Negative duodenum except for mass effect related to the abdominal aorta (see below).  Numerous subcentimeter low-density lesions in the liver, round and well delineated most compatible with benign cysts, although too small to characterize. There is a subtle 18 mm low-density area in the central liver near the IVC (series 2, image 22), nonspecific.  Negative gallbladder, spleen, pancreas and adrenal glands. Negative kidneys. No abdominal free fluid. Portal venous system appears patent.  Intra renal abdominal aortic aneurysm with mural soft and calcified plaque measuring up to 51 mm diameter. Length of the aneurysm is approximately 9 cm. Major abdominal aortic branches remain patent including the IMA. Bilateral iliac arteries are patent. Visible bilateral femoral arteries are patent with some plaque.  IMPRESSION: 1. Severely inflamed 6 cm segment of proximal sigmoid colon with  an appearance most compatible with acute diverticulitis. Evidence of a developing small diverticular abscess measuring 28 mm located adjacent to the left external iliac neurovascular bundle. No extraluminal gas identified.  2. Infrarenal abdominal aortic aneurysm measuring up to 51 mm diameter. Recommend follow up by abdomen and pelvis CTA in 3-6 months, and consider vascular surgery  referral/consultation if not already obtained. This recommendation follows ACR consensus guidelines: White Paper of the ACR Incidental Findings Committee II on Vascular Findings. J Am Coll Radiol 2013; 10:789-794.  3. Small nonspecific 18 mm hypodense focus in the liver adjacent to the hepatic IVC, superimposed on suspected multiple small hepatic cysts.   Electronically Signed   By: Augusto Gamble M.D.   On: 10/17/2013 13:08    Microbiology: Recent Results (from the past 240 hour(s))  MRSA PCR SCREENING     Status: None   Collection Time    10/17/13 10:54 PM      Result Value Range Status   MRSA by PCR NEGATIVE  NEGATIVE Final   Comment:            The GeneXpert MRSA Assay (FDA     approved for NASAL specimens     only), is one component of a     comprehensive MRSA colonization     surveillance program. It is not     intended to diagnose MRSA     infection nor to guide or     monitor treatment for     MRSA infections.     Labs: Basic Metabolic Panel:  Recent Labs Lab 10/17/13 1135 10/18/13 0429 10/19/13 0500  NA 136 137 138  K 3.8 3.4* 4.1  CL 100 99 101  CO2 25 27 25   GLUCOSE 124* 138* 111*  BUN 19 12 14   CREATININE 0.82 0.80 0.74  CALCIUM 9.4 8.7 8.9   Liver Function Tests:  Recent Labs Lab 10/17/13 1135  AST 12  ALT 7  ALKPHOS 66  BILITOT 1.2  PROT 7.0  ALBUMIN 3.8    Recent Labs Lab 10/17/13 1135  LIPASE 26   No results found for this basename: AMMONIA,  in the last 168 hours CBC:  Recent Labs Lab 10/17/13 1135 10/18/13 0429 10/19/13 0500  WBC 11.6* 11.0* 6.2  HGB 15.1  14.3 14.3  HCT 42.1 40.9 41.5  MCV 88.6 89.5 90.6  PLT 178 159 176   Cardiac Enzymes: No results found for this basename: CKTOTAL, CKMB, CKMBINDEX, TROPONINI,  in the last 168 hours BNP: BNP (last 3 results) No results found for this basename: PROBNP,  in the last 8760 hours CBG: No results found for this basename: GLUCAP,  in the last 168 hours  Time coordinating discharge: 45 minutes  Signed:  Tianah Lonardo  [facility] 10/19/2013, 9:41 AM

## 2013-10-19 NOTE — Care Management Note (Signed)
Per pt and family pt currently active with [facility]. MD orders for RN,PT,OT. Cm to fax facesheet, H/P, D/C summary and MD orders to [place]. No other needs or barriers identified at this time.     Roxy Manns Katira Dumais,MSN,RN [phone]

## 2013-10-19 NOTE — Progress Notes (Signed)
CSW left message with pt son.  CSW spoke to [facility] and faxed discharge paperwork.  Awaiting return phone call from pt son in order to assist with pt d/c needs and complete full psychosocial assessment at that time.   Jacklynn Lewis, MSW, LCSW Clinical Social Work [phone]

## 2013-10-19 NOTE — Progress Notes (Signed)
Pt. Was discharged back to [facility]. His son transported pt. Back the facility and his discharge packet, instruction, prescriptions were given pt. Son at discharge.

## 2013-10-19 NOTE — Progress Notes (Signed)
When passing patient's door, he indicated that he needed something. Stopped in to offer support. Patient said he was cold. Adjusted the temperature. 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 Listening; presence, support.

## 2013-10-19 NOTE — Progress Notes (Signed)
OT Cancellation Note/Screen  Patient Details Name: Larry Daniel MRN: [id number] DOB: 11-19-1955   Cancelled Treatment:    Reason Eval/Treat Not Completed: Other (comment)  Pt very confused:  Trying to get to his office in [employer].  Spoke to RN who said this is his baseline.  He is from ALF where he has help for adls.  Will defer OT eval to that venue if he has had a decline in function.  Larry Daniel 10/19/2013, 11:20 AM Marica Otter, OTR/L [phone] 10/19/2013

## 2013-10-19 NOTE — Progress Notes (Signed)
Clinical Social Work Department BRIEF PSYCHOSOCIAL ASSESSMENT 10/19/2013  Patient:  Larry Daniel, Larry Daniel     Account Number:  [id number]     Admit date:  10/17/2013  Clinical Social Worker:  Jacelyn Grip  Date/Time:  10/19/2013 04:00 PM  Referred by:  Physician  Date Referred:  10/19/2013 Referred for  ALF Placement   Other Referral:   Interview type:  Family Other interview type:    PSYCHOSOCIAL DATA Living Status:  FACILITY Admitted from facility:  [facility] Level of care:  Assisted Living Primary support name:  Gregary Signs Coryell/son Primary support relationship to patient:  CHILD, ADULT Degree of support available:   adequate    CURRENT CONCERNS Current Concerns  Post-Acute Placement   Other Concerns:    SOCIAL WORK ASSESSMENT / PLAN CSW received return phone call from pt son at 3:55 pm.    Pt son confirmed that pt son is agreeable to pt return to [facility].    Pt son is agreeable to transporting pt back to [facility].    CSW had received confirmation from [facility] that discharge paperwork was reviewed and approved for pt return.    RN notified in order to assist with getting pt ready for transport and d/c packet left at bedside in order for RN to provide to pt son to take back to [facility].    No further social work needs identified at this time.    CSW signing off.   Assessment/plan status:  No Further Intervention Required Other assessment/ plan:   Information/referral to community resources:   Referral back to [place], [facility] referral for [facility] needs    PATIENT'S/FAMILY'S RESPONSE TO PLAN OF CARE: Pt alert and oriented to person only. Pt has history of parkinsons and dementia. Once CSW was able to reach pt son, pt son was agreeable and eager to come to hospital to transport pt back to [facility].    Jacklynn Lewis, MSW, LCSW Clinical Social Work [phone]

## 2013-10-19 NOTE — Progress Notes (Signed)
PT Cancellation Note  Patient Details Name: Larry Daniel MRN: [id number] DOB: [birth year]   Cancelled Treatment:    Reason Eval/Treat Not Completed: PT screened, no needs identified, will sign off (returning to ALF today.)   Rada Hay 10/19/2013, 11:41 AM Blanchard Kelch PT [phone]

## 2013-10-19 NOTE — Progress Notes (Signed)
CSW has not yet been able to contact pt son.  CSW spoke to [facility] who discussed that pt is actually at [facility] Living [facility] with supportive care and per facility policy, the facility cannot provide transport for pt from hospital back to [facility].   CSW to continue attempts to reach pt son in order to facilitate pt discharge needs back to [facility].  Jacklynn Lewis, MSW, LCSW Clinical Social Work [phone]

## 2013-10-21 NOTE — Progress Notes (Signed)
Discharge summary sent to payer through [facility]  

## 2013-11-02 ENCOUNTER — Encounter: Payer: Self-pay | Admitting: Nurse Practitioner

## 2013-11-02 ENCOUNTER — Ambulatory Visit (INDEPENDENT_AMBULATORY_CARE_PROVIDER_SITE_OTHER): Payer: Medicare Other | Admitting: Nurse Practitioner

## 2013-11-02 VITALS — BP 104/78 | HR 80 | Ht 75.0 in | Wt 231.0 lb

## 2013-11-02 DIAGNOSIS — R1032 Left lower quadrant pain: Secondary | ICD-10-CM

## 2013-11-02 DIAGNOSIS — K5780 Diverticulitis of intestine, part unspecified, with perforation and abscess without bleeding: Secondary | ICD-10-CM

## 2013-11-02 DIAGNOSIS — K630 Abscess of intestine: Secondary | ICD-10-CM

## 2013-11-02 DIAGNOSIS — K5732 Diverticulitis of large intestine without perforation or abscess without bleeding: Secondary | ICD-10-CM

## 2013-11-02 DIAGNOSIS — R1031 Right lower quadrant pain: Secondary | ICD-10-CM

## 2013-11-02 MED ORDER — MOVIPREP 100 G PO SOLR: 1.0000 | ORAL | Status: DC

## 2013-11-02 NOTE — Progress Notes (Addendum)
HPI :  Patient is a 58 year old male referred by Hospitalist Danie BinderIskra Myers, MD. Patient was recently hospitalized with his first episode of acute diverticulitis. He improved inpatient on antibiotics and was discharged home on Augmentin. Patient feels much better. No significant abdominal pain. Bowel movements okay. No fevers/ chills. WBC was 11.0 on admission, down to 6 by discharge. [facility] as he spent 29 years in the [employer]  Past Medical History  Diagnosis Date  . Parkinson disease   . Depression   . Alzheimer's dementia    [facility]: No colon cancer  History  Substance Use Topics  . Smoking status: Never Smoker   . Smokeless tobacco: Never Used  . Alcohol Use: No   Current Outpatient Prescriptions  Medication Sig Dispense Refill  . acetaminophen (TYLENOL) 500 MG tablet Take 500 mg by mouth every 6 (six) hours as needed for pain.      [name]. alendronate (FOSAMAX) 70 MG tablet Take 70 mg by mouth once a week. Take with a full glass of water on an empty stomach.      [name]. amoxicillin-clavulanate (AUGMENTIN) 875-125 MG per tablet Take 1 tablet by mouth every 12 (twelve) hours.  22 tablet  0  . Carbidopa-Levodopa ER (SINEMET CR) 25-100 MG tablet controlled release Take 1-2 tablets by mouth See admin instructions. 6a take 2 tabs 8am take 1 tab 10am/12n/2pm/4pm/6pm take 1.5 tabs 8p take 1 tab 10p take 1.5 tabs      . Carbidopa-Levodopa ER (SINEMET CR) 25-100 MG tablet controlled release Take 1 tablet by mouth as needed (1x a day if complaint of freezing muscles).      . cholecalciferol (VITAMIN D) 1000 UNITS tablet Take 1,000 Units by mouth daily.      . cyanocobalamin 500 MCG tablet Take 500 mcg by mouth daily at 12 noon.       . donepezil (ARICEPT) 10 MG tablet Take 10 mg by mouth at bedtime.      . entacapone (COMTAN) 200 MG tablet Take 200 mg by mouth 5 (five) times daily. 6a/10a/2p/6p/10p      . folic acid (FOLVITE) 1 MG tablet Take 1 mg by mouth daily  at 12 noon.       . hydrocodone-ibuprofen (VICOPROFEN) 5-200 MG per tablet Take 1 tablet by mouth every 6 (six) hours as needed for pain.  30 tablet  0  . meloxicam (MOBIC) 7.5 MG tablet Take 7.5 mg by mouth daily at 12 noon.      . Multiple Vitamin (MULTIVITAMIN WITH MINERALS) TABS tablet Take 1 tablet by mouth daily.      . rasagiline (AZILECT) 0.5 MG TABS tablet Take 0.5 mg by mouth daily.       No current facility-administered medications for this visit.   No Known Allergies  Review of Systems: All systems reviewed and negative except where noted in HPI.    Ct Abdomen Pelvis W Contrast  10/17/2013   CLINICAL DATA:  58 year old male with left lower quadrant pain, suprapubic pain. Initial encounter.  EXAM: CT ABDOMEN AND PELVIS WITH CONTRAST  TECHNIQUE: Multidetector CT imaging of the abdomen and pelvis was performed using the standard protocol following bolus administration of intravenous contrast.  CONTRAST:  100mL OMNIPAQUE IOHEXOL 300 MG/ML  SOLN  COMPARISON:  Pelvis radiograph 01/04/2013.  FINDINGS: No pericardial or pleural effusion. Dependent pulmonary atelectasis.  Degenerative changes in the spine. Right hip arthroplasty. No acute osseous abnormality identified.  No pelvic free  fluid.  Negative rectum.  Confluent inflammation about the proximal sigmoid colon in the left lower quadrant (series 2, image 74). Diverticulosis in the region. Mesenteric inflammatory stranding and evidence of an organizing 26 mm fluid collection adjacent to the left external iliac neurovascular bundle (coronal image 32 and series 2, image 82) measuring 26-28 mm diameter. Trace free fluid or fashioned thickening in the left gutter. No extraluminal gas or pneumoperitoneum identified.  Diverticulosis continues in the left colon to the splenic flexure without additional inflammation. Negative transverse colon, right colon and small bowel. Small fact containing umbilical hernia. Small gastric hiatal hernia. Otherwise  negative stomach. Negative duodenum except for mass effect related to the abdominal aorta (see below).  Numerous subcentimeter low-density lesions in the liver, round and well delineated most compatible with benign cysts, although too small to characterize. There is a subtle 18 mm low-density area in the central liver near the IVC (series 2, image 22), nonspecific.  Negative gallbladder, spleen, pancreas and adrenal glands. Negative kidneys. No abdominal free fluid. Portal venous system appears patent.  Intra renal abdominal aortic aneurysm with mural soft and calcified plaque measuring up to 51 mm diameter. Length of the aneurysm is approximately 9 cm. Major abdominal aortic branches remain patent including the IMA. Bilateral iliac arteries are patent. Visible bilateral femoral arteries are patent with some plaque.  IMPRESSION: 1. Severely inflamed 6 cm segment of proximal sigmoid colon with an appearance most compatible with acute diverticulitis. Evidence of a developing small diverticular abscess measuring 28 mm located adjacent to the left external iliac neurovascular bundle. No extraluminal gas identified.  2. Infrarenal abdominal aortic aneurysm measuring up to 51 mm diameter. Recommend follow up by abdomen and pelvis CTA in 3-6 months, and consider vascular surgery referral/consultation if not already obtained. This recommendation follows ACR consensus guidelines: White Paper of the ACR Incidental Findings Committee II on Vascular Findings. J Am Coll Radiol 2013; 10:789-794.  3. Small nonspecific 18 mm hypodense focus in the liver adjacent to the hepatic IVC, superimposed on suspected multiple small hepatic cysts.   Electronically Signed   By: Augusto Gamble M.D.   On: 10/17/2013 13:08    Physical Exam: BP 104/78  Pulse 80  Ht 6\' 3"  (1.905 m)  Wt 231 lb (104.781 kg)  BMI 28.87 kg/m2 Constitutional: Pleasant,well-developed, white male in no acute distress. HEENT: Normocephalic and atraumatic. Conjunctivae  are normal. No scleral icterus. Neck supple.  Cardiovascular: Normal rate, regular rhythm.  Pulmonary/chest: Effort normal and breath sounds normal. No wheezing, rales or rhonchi. Abdominal: Soft, nondistended, nontender. Bowel sounds active throughout. There are no masses palpable. No hepatomegaly. Extremities: no edema Lymphadenopathy: No cervical adenopathy noted. Neurological: Alert and oriented. Movements are slow, foot tremors when in supine position Skin: Skin is warm and dry. No rashes noted. Psychiatric: Normal mood and affect. Behavior is normal.   ASSESSMENT AND PLAN:  2. 58 year old male with recent hospitalization (late December) for acute sigmoid diverticulitis associated with a small diverticular abscess (see above). Greatly improved on Augmentin. Patient is 58 years of age, he should have a colonoscopy to rule out any other colon  pathology / colon neoplasm. The risks, benefits, and alternatives to colonoscopy with possible biopsy and possible polypectomy were discussed with the patient and he consents to proceed. Question is whether he needs repeat imaging? Probably not unless he has recurrent pain between now and time of colonoscopy. At present patient feels well and abdominal exam is benign.   2.  infrarenal  abdominal aortic aneurysm, 5.1cm. According to hospital discharge summary patient has been previously evaluated and not surgical candidate due to advanced Parkinson's.   3. Parkinson's. Seems advanced for age. He is on three medications for Parkinson's.    Addendum: Reviewed and agree with initial management. Beverley Fiedler, MD

## 2013-11-02 NOTE — Patient Instructions (Addendum)
You have been scheduled for a colonoscopy with propofol. Please follow written instructions given to you at your visit today.   We have given you a prescription for the colonoscopy prep.

## 2013-11-29 ENCOUNTER — Encounter: Payer: Medicare Other | Admitting: Internal Medicine

## 2013-12-13 ENCOUNTER — Encounter: Payer: Self-pay | Admitting: Internal Medicine

## 2013-12-13 ENCOUNTER — Ambulatory Visit: Payer: Medicare Other | Admitting: Internal Medicine

## 2013-12-13 VITALS — BP 109/78 | HR 79 | Temp 97.8°F | Ht 75.0 in | Wt 231.0 lb

## 2013-12-13 NOTE — Progress Notes (Addendum)
Patient presented for colonoscopy today. Oriented to person but not time or place. Settled him into bed for procedure and started IV while interviewing for procedure. After IV started it was discovered that patient had not prepped and had also been eating without any restrictions. [facility] is a [facility] at [facility], phone call made to caregiver there who did not speak very good [place]. She states that she did not know whether patient had taken a prep for procedure, she was not on shift last evening and arrived at work late this morning. She states that he ate breakfast at 10 am and took his regularly scheduled medicines but nothing extra. Multiple phone calls made to Mia, his primary caregiver but unable to reach her. Gregary SignsSean, the patients son who accompanies the patient, phoned Mia and finally reached her and was informed that he did not prep for the procedure with meds or change in diet.  Discussed case with Dr. Rhea BeltonPyrtle. Patient was being seen for screening for polyps but also post hospitalization for diverticulitis. He had been seen in January by Lafe GarinP. Guenther who recommended plan and Dr. Rhea BeltonPyrtle was in agreement. Lafe GarinP. Guenther states the patient at that time was oriented to person, place and time and had advanced Parkinsons but not dementia. Plan made was to evaluate for neoplasm that could be mimicking diverticulitis.   Discussed with son, Gregary SignsSean, the reason for doing the procedure and need for rescheduling colonoscopy or an office visit for further evaluation. Gregary SignsSean is aware that the procedure had been recommended to evaluate for polyps and/or neoplasm or other pathology that may require intervention. We discussed that if something were to be found, such as cancer, the recommendation would likely be for surgery. He states that he and his brother (who is HCPOA) would not proceed with surgery due to his current neurological status. He states that his present neurological status is a result following hip surgery  approximately 1 year ago. He states that his father's symptoms of diverticulitis requiring hospitalization are improved. Gregary SignsSean states that he has had some complications with constipation this past weekend that the care providers at the center are readily addressing. Sean consents to not proceeding with further evaluation with colonoscopy at this time and does not feel that he needs further followup with an appointment with Dr. Rhea BeltonPyrtle. Encouraged him to schedule appointment to be seen by Dr. Rhea BeltonPyrtle if the current issues with constipation or other GI issues arise that need further evaluation. Gregary SignsSean understands and agrees to discharge and without further followup being scheduled at this time.

## 2014-01-22 ENCOUNTER — Encounter (HOSPITAL_COMMUNITY): Payer: Self-pay | Admitting: Emergency Medicine

## 2014-01-22 ENCOUNTER — Emergency Department (INDEPENDENT_AMBULATORY_CARE_PROVIDER_SITE_OTHER)
Admission: EM | Admit: 2014-01-22 | Discharge: 2014-01-22 | Disposition: A | Discharge status: Home / Self Care | Payer: Medicare Other | Source: Home / Self Care | Attending: Emergency Medicine | Admitting: Emergency Medicine

## 2014-01-22 DIAGNOSIS — F0280 Dementia in other diseases classified elsewhere without behavioral disturbance: Secondary | ICD-10-CM

## 2014-01-22 DIAGNOSIS — G20 Parkinson's disease: Secondary | ICD-10-CM

## 2014-01-22 DIAGNOSIS — R4182 Altered mental status, unspecified: Secondary | ICD-10-CM

## 2014-01-22 LAB — CBC WITH DIFFERENTIAL/PLATELET
Basophils Absolute: 0 10*3/uL (ref 0.0–0.1)
Basophils Relative: 0 % (ref 0–1)
EOS PCT: 2 % (ref 0–5)
Eosinophils Absolute: 0.1 10*3/uL (ref 0.0–0.7)
HCT: 44.9 % (ref 39.0–52.0)
Hemoglobin: 16.1 g/dL (ref 13.0–17.0)
Lymphocytes Relative: 20 % (ref 12–46)
Lymphs Abs: 1.4 10*3/uL (ref 0.7–4.0)
MCH: 32.4 pg (ref 26.0–34.0)
MCHC: 35.9 g/dL (ref 30.0–36.0)
MCV: 90.3 fL (ref 78.0–100.0)
Monocytes Absolute: 0.6 10*3/uL (ref 0.1–1.0)
Monocytes Relative: 9 % (ref 3–12)
NEUTROS ABS: 5.1 10*3/uL (ref 1.7–7.7)
Neutrophils Relative %: 69 % (ref 43–77)
Platelets: 167 10*3/uL (ref 150–400)
RBC: 4.97 MIL/uL (ref 4.22–5.81)
RDW: 12.3 % (ref 11.5–15.5)
WBC: 7.3 10*3/uL (ref 4.0–10.5)

## 2014-01-22 LAB — POCT I-STAT, CHEM 8
BUN: 24 mg/dL — AB (ref 6–23)
Calcium, Ion: 1.2 mmol/L (ref 1.12–1.23)
Chloride: 102 mEq/L (ref 96–112)
Creatinine, Ser: 1 mg/dL (ref 0.50–1.35)
Glucose, Bld: 90 mg/dL (ref 70–99)
HCT: 49 % (ref 39.0–52.0)
Hemoglobin: 16.7 g/dL (ref 13.0–17.0)
Potassium: 3.7 mEq/L (ref 3.7–5.3)
SODIUM: 141 meq/L (ref 137–147)
TCO2: 26 mmol/L (ref 0–100)

## 2014-01-22 LAB — POCT URINALYSIS DIP (DEVICE)
BILIRUBIN URINE: NEGATIVE
Glucose, UA: NEGATIVE mg/dL
KETONES UR: 15 mg/dL — AB
Leukocytes, UA: NEGATIVE
Nitrite: NEGATIVE
PH: 5.5 (ref 5.0–8.0)
Protein, ur: 30 mg/dL — AB
Urobilinogen, UA: 0.2 mg/dL (ref 0.0–1.0)

## 2014-01-22 NOTE — ED Provider Notes (Signed)
Medical screening examination/treatment/procedure(s) were performed by non-physician practitioner and as supervising physician I was immediately available for consultation/collaboration.  Samul Mcinroy, M.D.  Cheril Slattery C Bishoy Cupp, MD 01/22/14 2121 

## 2014-01-22 NOTE — ED Provider Notes (Signed)
CSN: [id number]     Arrival date & time 01/22/14  0946 History   First MD Initiated Contact with Patient 01/22/14 1054     No chief complaint on file.  (Consider location/radiation/quality/duration/timing/severity/associated sxs/prior Treatment) HPI Comments: 58 year old male with a history of rapidly progressive Parkinson's dementia is brought in by his son for evaluation of one week of restlessness, increased confusion, painful urination. The nurses at the facility where the father lives say that they believe he has a urinary tract infection. No fever, chills, NVD, hematuria. No abdominal pain or flank pain   Past Medical History  Diagnosis Date  . Parkinson disease   . Depression   . Alzheimer's dementia   . Anxiety   . Hyperlipidemia   . Hypertension   . Neuromuscular disorder    Past Surgical History  Procedure Laterality Date  . Cervical fusion    . Hip arthroplasty Right 01/04/2013    Procedure: ARTHROPLASTY BIPOLAR HIP;  Surgeon: Augustin Schooling, MD;  Location: [facility];  Service: Orthopedics;  Laterality: Right;   Family History  Problem Relation Age of Onset  . Lung cancer Paternal Grandmother    History  Substance Use Topics  . Smoking status: Never Smoker   . Smokeless tobacco: Never Used  . Alcohol Use: No    Review of Systems  Genitourinary: Positive for dysuria. Negative for urgency and hematuria.  Neurological: Positive for tremors (parkinsons).  Psychiatric/Behavioral: Positive for confusion, sleep disturbance and agitation.       Hx of parkinson dementia  All other systems reviewed and are negative.    Allergies  Review of patient's allergies indicates no known allergies.  Home Medications   Current Outpatient Rx  Name  Route  Sig  Dispense  Refill  . acetaminophen (TYLENOL) 500 MG tablet   Oral   Take 500 mg by mouth every 6 (six) hours as needed for pain.         . Carbidopa-Levodopa ER (SINEMET CR) 25-100 MG tablet controlled release    Oral   Take 1-2 tablets by mouth See admin instructions. 6a take 2 tabs 8am take 1 tab 10am/12n/2pm/4pm/6pm take 1.5 tabs 8p take 1 tab 10p take 1.5 tabs         . Carbidopa-Levodopa ER (SINEMET CR) 25-100 MG tablet controlled release   Oral   Take 1 tablet by mouth as needed (1x a day if complaint of freezing muscles).         . cholecalciferol (VITAMIN D) 1000 UNITS tablet   Oral   Take 1,000 Units by mouth daily.         . cyanocobalamin 500 MCG tablet   Oral   Take 500 mcg by mouth daily at 12 noon.          . donepezil (ARICEPT) 10 MG tablet   Oral   Take 10 mg by mouth at bedtime.         . entacapone (COMTAN) 200 MG tablet   Oral   Take 200 mg by mouth 5 (five) times daily. 2H/47M/5Y/6T/03T         . folic acid (FOLVITE) 1 MG tablet   Oral   Take 1 mg by mouth daily at 12 noon.          . meloxicam (MOBIC) 7.5 MG tablet   Oral   Take 7.5 mg by mouth daily at 12 noon.         [name] MOVIPREP 100 G SOLR   Oral  Take 1 kit (200 g total) by mouth as directed.   1 kit   0     Dispense as written.   . Multiple Vitamin (MULTIVITAMIN WITH MINERALS) TABS tablet   Oral   Take 1 tablet by mouth daily.         . rasagiline (AZILECT) 0.5 MG TABS tablet   Oral   Take 0.5 mg by mouth daily.         [name] alendronate (FOSAMAX) 70 MG tablet   Oral   Take 70 mg by mouth once a week. Take with a full glass of water on an empty stomach.         [name] amoxicillin-clavulanate (AUGMENTIN) 875-125 MG per tablet   Oral   Take 1 tablet by mouth every 12 (twelve) hours.   22 tablet   0   . hydrocodone-ibuprofen (VICOPROFEN) 5-200 MG per tablet   Oral   Take 1 tablet by mouth every 6 (six) hours as needed for pain.   30 tablet   0    BP 119/84  Pulse 85  Temp(Src) 98.1 F (36.7 C) (Oral)  Resp 16  SpO2 100% Physical Exam  Nursing note and vitals reviewed. Constitutional: He is oriented to person, place, and time. He appears well-developed and  well-nourished. No distress.  HENT:  Head: Normocephalic.  Cardiovascular: Normal rate, regular rhythm and normal heart sounds.  Exam reveals no gallop and no friction rub.   No murmur heard. Pulmonary/Chest: Effort normal and breath sounds normal. No respiratory distress. He has no wheezes. He has no rales.  Neurological: He is alert and oriented to person, place, and time. Coordination abnormal.  Tremor   Skin: Skin is warm and dry. No rash noted. He is not diaphoretic.  Psychiatric: His affect is blunt. His speech is tangential. He is slowed.    ED Course  Procedures (including critical care time) Labs Review Labs Reviewed  POCT URINALYSIS DIP (DEVICE) - Abnormal; Notable for the following:    Ketones, ur 15 (*)    Hgb urine dipstick TRACE (*)    Protein, ur 30 (*)    All other components within normal limits  POCT I-STAT, CHEM 8 - Abnormal; Notable for the following:    BUN 24 (*)    All other components within normal limits  URINE CULTURE  CBC WITH DIFFERENTIAL   Imaging Review No results found.   MDM   1. Mental status change   2. Dementia in Parkinson's disease    No clear cause found for patients symptoms.  Recommended transfer to ED for further eval, pt's son refuses for them to go.  I have explained the risks of undiagnosed problem, bleed, stroke, etc, he understands the risks and says he will follow up with PCP.  AMA form signed     Liam Graham, PA-C 01/22/14 1659

## 2014-01-22 NOTE — Discharge Instructions (Signed)
Altered Mental Status °Altered mental status most often refers to an abnormal change in your responsiveness and awareness. It can affect your speech, thought, mobility, memory, attention span, or alertness. It can range from slight confusion to complete unresponsiveness (coma). Altered mental status can be a sign of a serious underlying medical condition. Rapid evaluation and medical treatment is necessary for patients having an altered mental status. °CAUSES  °· Low blood sugar (hypoglycemia) or diabetes. °· Severe loss of body fluids (dehydration) or a body salt (electrolyte) imbalance. °· A stroke or other neurologic problem, such as dementia or delirium. °· A head injury or tumor. °· A drug or alcohol overdose. °· Exposure to toxins or poisons. °· Depression, anxiety, and stress. °· A low oxygen level (hypoxia). °· An infection. °· Blood loss. °· Twitching or shaking (seizure). °· Heart problems, such as heart attack or heart rhythm problems (arrhythmias). °· A body temperature that is too low or too high (hypothermia or hyperthermia). °DIAGNOSIS  °A diagnosis is based on your history, symptoms, physical and neurologic examinations, and diagnostic tests. Diagnostic tests may include: °· Measurement of your blood pressure, pulse, breathing, and oxygen levels (vital signs). °· Blood tests. °· Urine tests. °· X-ray exams. °· A computerized magnetic scan (magnetic resonance imaging, MRI). °· A computerized X-ray scan (computed tomography, CT scan). °TREATMENT  °Treatment will depend on the cause. Treatment may include: °· Management of an underlying medical or mental health condition. °· Critical care or support in the hospital. °HOME CARE INSTRUCTIONS  °· Only take over-the-counter or prescription medicines for pain, discomfort, or fever as directed by your caregiver. °· Manage underlying conditions as directed by your caregiver. °· Eat a healthy, well-balanced diet to maintain strength. °· Join a support group or  prevention program to cope with the condition or trauma that caused the altered mental status. Ask your caregiver to help choose a program that works for you. °· Follow up with your caregiver for further examination, therapy, or testing as directed. °SEEK MEDICAL CARE IF:  °· You feel unwell or have chills. °· You or your family notice a change in your behavior or your alertness. °· You have trouble following your caregiver's treatment plan. °· You have questions or concerns. °SEEK IMMEDIATE MEDICAL CARE IF:  °· You have a rapid heartbeat or have chest pain. °· You have difficulty breathing. °· You have a fever. °· You have a headache with a stiff neck. °· You cough up blood. °· You have blood in your urine or stool. °· You have severe agitation or confusion. °MAKE SURE YOU:  °· Understand these instructions. °· Will watch your condition. °· Will get help right away if you are not doing well or get worse. °Document Released: 03/26/2010 Document Revised: 12/29/2011 Document Reviewed: 03/26/2010 °[employer]® Patient Information ©2014 [employer]. ° °

## 2014-01-22 NOTE — ED Notes (Signed)
58 yr old male is here with his son with complaints of frequent painful urination for one week. His son states that the nursing facility he is acts wanted him to be seen to see if he had a UTI due to some confusion and restlessness that was a little more than normal. His son states he does have Dementia. Denies: fever, sob, chest pain

## 2014-01-22 NOTE — ED Notes (Signed)
Patient unable to give sample and is drinking water

## 2014-01-23 LAB — URINE CULTURE: Colony Count: 8000

## 2014-02-10 ENCOUNTER — Encounter (HOSPITAL_COMMUNITY): Payer: Self-pay | Admitting: Emergency Medicine

## 2014-02-10 ENCOUNTER — Emergency Department (HOSPITAL_COMMUNITY)
Admission: EM | Admit: 2014-02-10 | Discharge: 2014-02-11 | Disposition: A | Discharge status: Home / Self Care | Payer: Medicare Other | Attending: Emergency Medicine | Admitting: Emergency Medicine

## 2014-02-10 DIAGNOSIS — F411 Generalized anxiety disorder: Secondary | ICD-10-CM | POA: Insufficient documentation

## 2014-02-10 DIAGNOSIS — R451 Restlessness and agitation: Secondary | ICD-10-CM

## 2014-02-10 DIAGNOSIS — F0390 Unspecified dementia without behavioral disturbance: Secondary | ICD-10-CM

## 2014-02-10 DIAGNOSIS — E785 Hyperlipidemia, unspecified: Secondary | ICD-10-CM | POA: Insufficient documentation

## 2014-02-10 DIAGNOSIS — I10 Essential (primary) hypertension: Secondary | ICD-10-CM | POA: Insufficient documentation

## 2014-02-10 DIAGNOSIS — Z87440 Personal history of urinary (tract) infections: Secondary | ICD-10-CM | POA: Diagnosis not present

## 2014-02-10 DIAGNOSIS — G20 Parkinson's disease: Secondary | ICD-10-CM | POA: Insufficient documentation

## 2014-02-10 DIAGNOSIS — F02818 Dementia in other diseases classified elsewhere, unspecified severity, with other behavioral disturbance: Secondary | ICD-10-CM | POA: Insufficient documentation

## 2014-02-10 DIAGNOSIS — G709 Myoneural disorder, unspecified: Secondary | ICD-10-CM | POA: Diagnosis not present

## 2014-02-10 DIAGNOSIS — F3289 Other specified depressive episodes: Secondary | ICD-10-CM | POA: Insufficient documentation

## 2014-02-10 DIAGNOSIS — Z792 Long term (current) use of antibiotics: Secondary | ICD-10-CM | POA: Diagnosis not present

## 2014-02-10 DIAGNOSIS — F329 Major depressive disorder, single episode, unspecified: Secondary | ICD-10-CM | POA: Insufficient documentation

## 2014-02-10 DIAGNOSIS — Z7983 Long term (current) use of bisphosphonates: Secondary | ICD-10-CM | POA: Diagnosis not present

## 2014-02-10 DIAGNOSIS — F0280 Dementia in other diseases classified elsewhere without behavioral disturbance: Secondary | ICD-10-CM | POA: Insufficient documentation

## 2014-02-10 DIAGNOSIS — Z791 Long term (current) use of non-steroidal anti-inflammatories (NSAID): Secondary | ICD-10-CM | POA: Insufficient documentation

## 2014-02-10 DIAGNOSIS — G309 Alzheimer's disease, unspecified: Principal | ICD-10-CM | POA: Insufficient documentation

## 2014-02-10 DIAGNOSIS — F0281 Dementia in other diseases classified elsewhere with behavioral disturbance: Secondary | ICD-10-CM | POA: Insufficient documentation

## 2014-02-10 DIAGNOSIS — G20A1 Parkinson's disease without dyskinesia, without mention of fluctuations: Secondary | ICD-10-CM | POA: Insufficient documentation
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
## 2014-02-11 ENCOUNTER — Emergency Department (HOSPITAL_COMMUNITY): Payer: Medicare Other

## 2014-02-11 ENCOUNTER — Encounter (HOSPITAL_COMMUNITY): Payer: Self-pay | Admitting: Radiology

## 2014-02-11 DIAGNOSIS — F0280 Dementia in other diseases classified elsewhere without behavioral disturbance: Secondary | ICD-10-CM | POA: Diagnosis not present

## 2014-02-11 LAB — URINALYSIS, ROUTINE W REFLEX MICROSCOPIC
Bilirubin Urine: NEGATIVE
Glucose, UA: NEGATIVE mg/dL
Hgb urine dipstick: NEGATIVE
Ketones, ur: NEGATIVE mg/dL
LEUKOCYTES UA: NEGATIVE
NITRITE: NEGATIVE
Protein, ur: NEGATIVE mg/dL
SPECIFIC GRAVITY, URINE: 1.03 (ref 1.005–1.030)
Urobilinogen, UA: 0.2 mg/dL (ref 0.0–1.0)
pH: 5.5 (ref 5.0–8.0)

## 2014-02-11 LAB — CBC WITH DIFFERENTIAL/PLATELET
BASOS ABS: 0 10*3/uL (ref 0.0–0.1)
BASOS PCT: 0 % (ref 0–1)
Eosinophils Absolute: 0.2 10*3/uL (ref 0.0–0.7)
Eosinophils Relative: 3 % (ref 0–5)
HCT: 41.4 % (ref 39.0–52.0)
HEMOGLOBIN: 14.2 g/dL (ref 13.0–17.0)
Lymphocytes Relative: 19 % (ref 12–46)
Lymphs Abs: 1.5 10*3/uL (ref 0.7–4.0)
MCH: 31 pg (ref 26.0–34.0)
MCHC: 34.3 g/dL (ref 30.0–36.0)
MCV: 90.4 fL (ref 78.0–100.0)
MONOS PCT: 10 % (ref 3–12)
Monocytes Absolute: 0.8 10*3/uL (ref 0.1–1.0)
NEUTROS ABS: 5.1 10*3/uL (ref 1.7–7.7)
NEUTROS PCT: 67 % (ref 43–77)
Platelets: 163 10*3/uL (ref 150–400)
RBC: 4.58 MIL/uL (ref 4.22–5.81)
RDW: 12.1 % (ref 11.5–15.5)
WBC: 7.6 10*3/uL (ref 4.0–10.5)

## 2014-02-11 LAB — BASIC METABOLIC PANEL
BUN: 20 mg/dL (ref 6–23)
CO2: 24 mEq/L (ref 19–32)
Calcium: 9 mg/dL (ref 8.4–10.5)
Chloride: 101 mEq/L (ref 96–112)
Creatinine, Ser: 0.65 mg/dL (ref 0.50–1.35)
Glucose, Bld: 100 mg/dL — ABNORMAL HIGH (ref 70–99)
POTASSIUM: 4 meq/L (ref 3.7–5.3)
Sodium: 138 mEq/L (ref 137–147)

## 2014-02-11 LAB — CBG MONITORING, ED: GLUCOSE-CAPILLARY: 111 mg/dL — AB (ref 70–99)

## 2014-02-11 NOTE — Discharge Instructions (Signed)
If you were given medicines take as directed.  If you are on coumadin or contraceptives realize their levels and effectiveness is altered by many different medicines.  If you have any reaction (rash, tongues swelling, other) to the medicines stop taking and see a physician.   Please follow up as directed and return to the ER or see a physician for new or worsening symptoms.  Thank you. Filed Vitals:   02/10/14 2314 02/10/14 2315  BP: 164/68   Pulse: 90   Temp: 98.3 F (36.8 C)   TempSrc: Oral   Resp: 18   SpO2: 99% 97%
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
## 2014-02-11 NOTE — ED Notes (Signed)
Pt.'s son Melina Modena/POa Mr. Margette FastJames Huynh Jr. Called and updated of pt.'scondition;for discharge.  Receiving facility called and report given ,pt. Is for discharge.

## 2014-02-12 LAB — URINE CULTURE: Colony Count: 10000

## 2014-04-22 ENCOUNTER — Emergency Department (HOSPITAL_COMMUNITY): Payer: Medicare Other

## 2014-04-22 ENCOUNTER — Encounter (HOSPITAL_COMMUNITY): Payer: Self-pay | Admitting: Emergency Medicine

## 2014-04-22 ENCOUNTER — Emergency Department (HOSPITAL_COMMUNITY)
Admission: EM | Admit: 2014-04-22 | Discharge: 2014-04-23 | Disposition: A | Discharge status: Home / Self Care | Payer: Medicare Other | Attending: Emergency Medicine | Admitting: Emergency Medicine

## 2014-04-22 DIAGNOSIS — G309 Alzheimer's disease, unspecified: Secondary | ICD-10-CM | POA: Insufficient documentation

## 2014-04-22 DIAGNOSIS — Z79899 Other long term (current) drug therapy: Secondary | ICD-10-CM | POA: Insufficient documentation

## 2014-04-22 DIAGNOSIS — F3289 Other specified depressive episodes: Secondary | ICD-10-CM | POA: Insufficient documentation

## 2014-04-22 DIAGNOSIS — Y9389 Activity, other specified: Secondary | ICD-10-CM | POA: Insufficient documentation

## 2014-04-22 DIAGNOSIS — G20A1 Parkinson's disease without dyskinesia, without mention of fluctuations: Secondary | ICD-10-CM | POA: Insufficient documentation

## 2014-04-22 DIAGNOSIS — F329 Major depressive disorder, single episode, unspecified: Secondary | ICD-10-CM | POA: Insufficient documentation

## 2014-04-22 DIAGNOSIS — E785 Hyperlipidemia, unspecified: Secondary | ICD-10-CM | POA: Insufficient documentation

## 2014-04-22 DIAGNOSIS — F0280 Dementia in other diseases classified elsewhere without behavioral disturbance: Secondary | ICD-10-CM | POA: Insufficient documentation

## 2014-04-22 DIAGNOSIS — Y9289 Other specified places as the place of occurrence of the external cause: Secondary | ICD-10-CM | POA: Insufficient documentation

## 2014-04-22 DIAGNOSIS — F411 Generalized anxiety disorder: Secondary | ICD-10-CM | POA: Insufficient documentation

## 2014-04-22 DIAGNOSIS — X31XXXA Exposure to excessive natural cold, initial encounter: Secondary | ICD-10-CM | POA: Insufficient documentation

## 2014-04-22 DIAGNOSIS — G20 Parkinson's disease: Secondary | ICD-10-CM | POA: Insufficient documentation

## 2014-04-22 DIAGNOSIS — T699XXA Effect of reduced temperature, unspecified, initial encounter: Secondary | ICD-10-CM

## 2014-04-22 DIAGNOSIS — T698XXA Other specified effects of reduced temperature, initial encounter: Secondary | ICD-10-CM | POA: Insufficient documentation

## 2014-04-22 DIAGNOSIS — Z792 Long term (current) use of antibiotics: Secondary | ICD-10-CM | POA: Insufficient documentation

## 2014-04-22 LAB — COMPREHENSIVE METABOLIC PANEL
ALBUMIN: 3.7 g/dL (ref 3.5–5.2)
ALT: 6 U/L (ref 0–53)
AST: 17 U/L (ref 0–37)
Alkaline Phosphatase: 62 U/L (ref 39–117)
Anion gap: 11 (ref 5–15)
BUN: 27 mg/dL — ABNORMAL HIGH (ref 6–23)
CO2: 27 mEq/L (ref 19–32)
CREATININE: 0.71 mg/dL (ref 0.50–1.35)
Calcium: 8.9 mg/dL (ref 8.4–10.5)
Chloride: 101 mEq/L (ref 96–112)
GFR calc Af Amer: >90 mL/min (ref >90)
GFR calc non Af Amer: >90 mL/min (ref >90)
Glucose, Bld: 88 mg/dL (ref 70–99)
POTASSIUM: 4.1 meq/L (ref 3.7–5.3)
Sodium: 139 mEq/L (ref 137–147)
Total Bilirubin: 0.5 mg/dL (ref 0.3–1.2)
Total Protein: 6.6 g/dL (ref 6.0–8.3)

## 2014-04-22 LAB — CBC
HCT: 40.4 % (ref 39.0–52.0)
Hemoglobin: 13.9 g/dL (ref 13.0–17.0)
MCH: 31.4 pg (ref 26.0–34.0)
MCHC: 34.4 g/dL (ref 30.0–36.0)
MCV: 91.4 fL (ref 78.0–100.0)
PLATELETS: 171 10*3/uL (ref 150–400)
RBC: 4.42 MIL/uL (ref 4.22–5.81)
RDW: 12.4 % (ref 11.5–15.5)
WBC: 6.6 10*3/uL (ref 4.0–10.5)

## 2014-04-22 LAB — URINALYSIS, ROUTINE W REFLEX MICROSCOPIC
Bilirubin Urine: NEGATIVE
Glucose, UA: NEGATIVE mg/dL
Hgb urine dipstick: NEGATIVE
Ketones, ur: 15 mg/dL — AB
LEUKOCYTES UA: NEGATIVE
NITRITE: NEGATIVE
PH: 6 (ref 5.0–8.0)
Protein, ur: NEGATIVE mg/dL
SPECIFIC GRAVITY, URINE: 1.034 — AB (ref 1.005–1.030)
Urobilinogen, UA: 0.2 mg/dL (ref 0.0–1.0)

## 2014-04-22 LAB — RAPID URINE DRUG SCREEN, HOSP PERFORMED
AMPHETAMINES: NOT DETECTED
BARBITURATES: NOT DETECTED
BENZODIAZEPINES: NOT DETECTED
Cocaine: NOT DETECTED
Opiates: NOT DETECTED
Tetrahydrocannabinol: NOT DETECTED

## 2014-04-22 LAB — TROPONIN I

## 2014-04-22 MED ORDER — OXYCODONE-ACETAMINOPHEN 5-325 MG PO TABS
1.0000 | ORAL_TABLET | Freq: Once | ORAL | Status: AC
  Administered 2014-04-22: 1 via ORAL
  Filled 2014-04-22: qty 1

## 2014-04-22 NOTE — ED Notes (Signed)
Pt requesting pain medicine.

## 2014-04-22 NOTE — ED Provider Notes (Signed)
CSN: [id number]     Arrival date & time 04/22/14  1612 History   First MD Initiated Contact with Patient 04/22/14 1618     Chief Complaint  Patient presents with  . Altered Mental Status     (Consider location/radiation/quality/duration/timing/severity/associated sxs/prior Treatment) Patient is a 58 y.o. male presenting with altered mental status. The history is provided by the patient (CNA Tasha).  Altered Mental Status Presenting symptoms: behavior changes   Severity:  Moderate Most recent episode:  Today Episode history:  Single Timing:  Constant Progression:  Resolved Chronicity:  New Context comment:  At rest Associated symptoms: no abdominal pain, no fever, no headaches, no nausea and no vomiting     Past Medical History  Diagnosis Date  . Parkinson disease   . Depression   . Alzheimer's dementia   . Anxiety   . Hyperlipidemia   . Hypertension   . Neuromuscular disorder    Past Surgical History  Procedure Laterality Date  . Cervical fusion    . Hip arthroplasty Right 01/04/2013    Procedure: ARTHROPLASTY BIPOLAR HIP;  Surgeon: Augustin Schooling, MD;  Location: [facility];  Service: Orthopedics;  Laterality: Right;   Family History  Problem Relation Age of Onset  . Lung cancer Paternal Grandmother    History  Substance Use Topics  . Smoking status: Never Smoker   . Smokeless tobacco: Never Used  . Alcohol Use: No    Review of Systems  Constitutional: Negative for fever.  HENT: Negative for drooling and rhinorrhea.   Eyes: Negative for pain.  Respiratory: Negative for cough and shortness of breath.   Cardiovascular: Negative for chest pain and leg swelling.  Gastrointestinal: Negative for nausea, vomiting, abdominal pain and diarrhea.  Genitourinary: Negative for dysuria and hematuria.  Musculoskeletal: Negative for gait problem and neck pain.  Skin: Negative for color change.  Neurological: Negative for numbness and headaches.  Hematological: Negative for  adenopathy.  Psychiatric/Behavioral: Negative for behavioral problems.  All other systems reviewed and are negative.     Allergies  Review of patient's allergies indicates no known allergies.  Home Medications   Prior to Admission medications   Medication Sig Start Date End Date Taking? Authorizing Provider  acetaminophen (TYLENOL) 500 MG tablet Take 500 mg by mouth every 6 (six) hours as needed for pain.    Historical Provider, MD  alendronate (FOSAMAX) 70 MG tablet Take 70 mg by mouth once a week. Take with a full glass of water on an empty stomach.    Historical Provider, MD  amoxicillin-clavulanate (AUGMENTIN) 875-125 MG per tablet Take 1 tablet by mouth every 12 (twelve) hours. 10/19/13   Janece Canterbury, MD  Carbidopa-Levodopa ER (SINEMET CR) 25-100 MG tablet controlled release Take 1-2 tablets by mouth See admin instructions. 6a take 2 tabs 8am take 1 tab 10am/12n/2pm/4pm/6pm take 1.5 tabs 8p take 1 tab 10p take 1.5 tabs    Historical Provider, MD  Carbidopa-Levodopa ER (SINEMET CR) 25-100 MG tablet controlled release Take 1 tablet by mouth as needed (1x a day if complaint of freezing muscles).    Historical Provider, MD  cholecalciferol (VITAMIN D) 1000 UNITS tablet Take 1,000 Units by mouth daily.    Historical Provider, MD  cyanocobalamin 500 MCG tablet Take 500 mcg by mouth daily at 12 noon.     Historical Provider, MD  donepezil (ARICEPT) 10 MG tablet Take 10 mg by mouth at bedtime.    Historical Provider, MD  entacapone (COMTAN) 200 MG tablet Take 200  mg by mouth 5 (five) times daily. 6a/10a/2p/6p/10p    Historical Provider, MD  folic acid (FOLVITE) 1 MG tablet Take 1 mg by mouth daily at 12 noon.     Historical Provider, MD  hydrocodone-ibuprofen (VICOPROFEN) 5-200 MG per tablet Take 1 tablet by mouth every 6 (six) hours as needed for pain. 10/19/13   Janece Canterbury, MD  meloxicam (MOBIC) 7.5 MG tablet Take 7.5 mg by mouth daily at 12 noon.    Historical Provider, MD   MOVIPREP 100 G SOLR Take 1 kit (200 g total) by mouth as directed. 11/02/13   Willia Craze, NP  Multiple Vitamin (MULTIVITAMIN WITH MINERALS) TABS tablet Take 1 tablet by mouth daily.    Historical Provider, MD  rasagiline (AZILECT) 0.5 MG TABS tablet Take 0.5 mg by mouth daily.    Historical Provider, MD   BP 149/95  Temp(Src) 98.4 F (36.9 C) (Oral)  Resp 15  SpO2 97% Physical Exam  Nursing note and vitals reviewed. Constitutional: He is oriented to person, place, and time. He appears well-developed and well-nourished.  HENT:  Head: Normocephalic and atraumatic.  Right Ear: External ear normal.  Left Ear: External ear normal.  Nose: Nose normal.  Mouth/Throat: Oropharynx is clear and moist. No oropharyngeal exudate.  Eyes: Conjunctivae and EOM are normal. Pupils are equal, round, and reactive to light.  Neck: Normal range of motion. Neck supple.  Cardiovascular: Normal rate, regular rhythm, normal heart sounds and intact distal pulses.  Exam reveals no gallop and no friction rub.   No murmur heard. Pulmonary/Chest: Effort normal and breath sounds normal. No respiratory distress. He has no wheezes.  Abdominal: Soft. Bowel sounds are normal. He exhibits no distension. There is no tenderness. There is no rebound and no guarding.  Musculoskeletal: Normal range of motion. He exhibits no edema and no tenderness.  Neurological: He is alert and oriented to person, place, and time.  alert, oriented x3 speech: normal in context and clarity memory: intact grossly cranial nerves II-XII: intact motor strength: full proximally and distally no involuntary movements or tremors sensation: intact to light touch diffusely  cerebellar: finger-to-nose intact    Skin: Skin is warm and dry.  Psychiatric: He has a normal mood and affect. His behavior is normal.    ED Course  Procedures (including critical care time) Labs Review Labs Reviewed  COMPREHENSIVE METABOLIC PANEL - Abnormal;  Notable for the following:    BUN 27 (*)    All other components within normal limits  URINALYSIS, ROUTINE W REFLEX MICROSCOPIC - Abnormal; Notable for the following:    Color, Urine AMBER (*)    Specific Gravity, Urine 1.034 (*)    Ketones, ur 15 (*)    All other components within normal limits  URINE RAPID DRUG SCREEN (HOSP PERFORMED)  CBC  TROPONIN I    Imaging Review Ct Head Wo Contrast  04/22/2014   CLINICAL DATA:  Mental status change with history of dementia  EXAM: CT HEAD WITHOUT CONTRAST  TECHNIQUE: Contiguous axial images were obtained from the base of the skull through the vertex without intravenous contrast.  COMPARISON:  Noncontrast CT scan of the brain dated February 11, 2014  FINDINGS: There is mild diffuse cerebral atrophy which is stable. There is no intracranial hemorrhage, acute ischemic change, nor intracranial mass effect. The ventricles are normal. The cerebellum and brainstem are normal.  There is stable minimal mucoperiosteal thickening in the left maxillary sinus. There is no skull fracture nor lytic nor blastic  lesion.  IMPRESSION: There is no acute intracranial abnormality.   Electronically Signed   By: David  Martinique   On: 04/22/2014 17:35     EKG Interpretation   Date/Time:  Saturday April 22 2014 16:28:34 EDT Ventricular Rate:  80 PR Interval:  173 QRS Duration: 87 QT Interval:  378 QTC Calculation: 436 R Axis:   -2 Text Interpretation:  Sinus rhythm Ventricular premature complex Low  voltage, precordial leads Consider anterior infarct Minimal ST elevation,  lateral leads No significant change since last tracing Confirmed by  Sephira Zellman  MD, Jyren Cerasoli ([id number]) on 04/22/2014 5:05:37 PM      MDM   Final diagnoses:  Freezing, initial encounter    5:01 PM 58 y.o. male with a history of Parkinson's, Alzheimer's dementia who presents with sensation of feeling frozen and immobilized. He is an independent living and reportedly called for nursing around 2:30. I spoke  with Aniceto Boss who is a CNA who told me that on initial evaluation he appeared normal and was conversant. Upon returning 15 minutes later he was immobilized, would barely open his eyes, and would not follow commands. EMS was called and the patient presents here for evaluation. He is alert and oriented x1. He is sitting up in bed and is currently asymptomatic. He has no focal motor or sensory deficits. Will get labs and imaging. The patient states he has had similar symptoms previously. He is a very poor historian and it is difficult to get information about the episode or duration.  8:47 PM: I discussed the case w/ Dr. Aram Beecham. Pt does have a hx of parkinsons and is on carbidopa-levidopa. Dr. Aram Beecham believes the pt is likely having freezing episodes related to the dosing of this medication. He recommends the pt f/u w his neurologist early next week for med adjustment which can be done as an outpt. Pt remains asx on exam. I had nursing call his facility to notify them of the recommendation and i also put it on his d/c paperwork.  I have discussed the diagnosis/risks/treatment options with the patient and believe the pt to be eligible for discharge home to follow-up with his neurologist next week. We also discussed returning to the ED immediately if new or worsening sx occur. We discussed the sx which are most concerning (e.g., worsening episodes, cp, sob, fever, weakness, numbness) that necessitate immediate return. Medications administered to the patient during their visit and any new prescriptions provided to the patient are listed below.  Medications given during this visit Medications  oxyCODONE-acetaminophen (PERCOCET/ROXICET) 5-325 MG per tablet 1 tablet (1 tablet Oral Given 04/22/14 1742)    Discharge Medication List as of 04/22/2014  8:48 PM       Shea Evans Ruthe Mannan, MD 04/23/14 1052

## 2014-04-22 NOTE — ED Notes (Signed)
[facility]. History of dementia. Home health care nurse states patient was slow to respond today. Reports "freezing" episodes, where patient is unable to respond for several seconds. Pt is alert, but confused upon arrival. 20g L hand. CBG 131.

## 2014-08-21 ENCOUNTER — Emergency Department (HOSPITAL_COMMUNITY)
Admission: EM | Admit: 2014-08-21 | Discharge: 2014-08-21 | Disposition: A | Discharge status: Home / Self Care | Payer: Medicare Other | Attending: Emergency Medicine | Admitting: Emergency Medicine

## 2014-08-21 ENCOUNTER — Encounter (HOSPITAL_COMMUNITY): Payer: Self-pay | Admitting: Emergency Medicine

## 2014-08-21 ENCOUNTER — Emergency Department (HOSPITAL_COMMUNITY): Payer: Medicare Other

## 2014-08-21 DIAGNOSIS — I10 Essential (primary) hypertension: Secondary | ICD-10-CM | POA: Insufficient documentation

## 2014-08-21 DIAGNOSIS — Z79899 Other long term (current) drug therapy: Secondary | ICD-10-CM | POA: Diagnosis not present

## 2014-08-21 DIAGNOSIS — Z791 Long term (current) use of non-steroidal anti-inflammatories (NSAID): Secondary | ICD-10-CM | POA: Insufficient documentation

## 2014-08-21 DIAGNOSIS — F0280 Dementia in other diseases classified elsewhere without behavioral disturbance: Secondary | ICD-10-CM | POA: Diagnosis not present

## 2014-08-21 DIAGNOSIS — G309 Alzheimer's disease, unspecified: Secondary | ICD-10-CM | POA: Insufficient documentation

## 2014-08-21 DIAGNOSIS — R4182 Altered mental status, unspecified: Secondary | ICD-10-CM | POA: Diagnosis present

## 2014-08-21 DIAGNOSIS — Z8639 Personal history of other endocrine, nutritional and metabolic disease: Secondary | ICD-10-CM | POA: Diagnosis not present

## 2014-08-21 DIAGNOSIS — G20 Parkinson's disease: Secondary | ICD-10-CM | POA: Insufficient documentation

## 2014-08-21 DIAGNOSIS — F419 Anxiety disorder, unspecified: Secondary | ICD-10-CM | POA: Insufficient documentation

## 2014-08-21 LAB — URINALYSIS, ROUTINE W REFLEX MICROSCOPIC
Bilirubin Urine: NEGATIVE
Glucose, UA: NEGATIVE mg/dL
HGB URINE DIPSTICK: NEGATIVE
Ketones, ur: NEGATIVE mg/dL
LEUKOCYTES UA: NEGATIVE
NITRITE: NEGATIVE
Protein, ur: NEGATIVE mg/dL
SPECIFIC GRAVITY, URINE: 1.024 (ref 1.005–1.030)
UROBILINOGEN UA: 0.2 mg/dL (ref 0.0–1.0)
pH: 5.5 (ref 5.0–8.0)

## 2014-08-21 LAB — CBC WITH DIFFERENTIAL/PLATELET
Basophils Absolute: 0 10*3/uL (ref 0.0–0.1)
Basophils Relative: 0 % (ref 0–1)
Eosinophils Absolute: 0 10*3/uL (ref 0.0–0.7)
Eosinophils Relative: 1 % (ref 0–5)
HCT: 45.1 % (ref 39.0–52.0)
Hemoglobin: 15.6 g/dL (ref 13.0–17.0)
LYMPHS ABS: 1.1 10*3/uL (ref 0.7–4.0)
LYMPHS PCT: 16 % (ref 12–46)
MCH: 31.5 pg (ref 26.0–34.0)
MCHC: 34.6 g/dL (ref 30.0–36.0)
MCV: 90.9 fL (ref 78.0–100.0)
Monocytes Absolute: 0.6 10*3/uL (ref 0.1–1.0)
Monocytes Relative: 8 % (ref 3–12)
NEUTROS ABS: 5.5 10*3/uL (ref 1.7–7.7)
NEUTROS PCT: 75 % (ref 43–77)
Platelets: 160 10*3/uL (ref 150–400)
RBC: 4.96 MIL/uL (ref 4.22–5.81)
RDW: 12.1 % (ref 11.5–15.5)
WBC: 7.2 10*3/uL (ref 4.0–10.5)

## 2014-08-21 LAB — BASIC METABOLIC PANEL
ANION GAP: 14 (ref 5–15)
BUN: 18 mg/dL (ref 6–23)
CO2: 24 meq/L (ref 19–32)
Calcium: 9.4 mg/dL (ref 8.4–10.5)
Chloride: 101 mEq/L (ref 96–112)
Creatinine, Ser: 0.75 mg/dL (ref 0.50–1.35)
GFR calc Af Amer: >90 mL/min (ref >90)
GLUCOSE: 108 mg/dL — AB (ref 70–99)
POTASSIUM: 3.9 meq/L (ref 3.7–5.3)
SODIUM: 139 meq/L (ref 137–147)

## 2014-08-21 LAB — I-STAT TROPONIN, ED: TROPONIN I, POC: 0 ng/mL (ref 0.00–0.08)

## 2014-08-21 MED ORDER — CARBIDOPA-LEVODOPA 25-100 MG PO TABS
1.5000 | ORAL_TABLET | Freq: Every day | ORAL | Status: DC
  Filled 2014-08-21: qty 1.5

## 2014-08-21 MED ORDER — MEMANTINE HCL 10 MG PO TABS
10.0000 mg | ORAL_TABLET | Freq: Two times a day (BID) | ORAL | Status: DC
  Administered 2014-08-21: 10 mg via ORAL
  Filled 2014-08-21 (×2): qty 1

## 2014-08-21 MED ORDER — CARBIDOPA-LEVODOPA 25-100 MG PO TABS
2.0000 | ORAL_TABLET | ORAL | Status: DC
  Administered 2014-08-21: 2 via ORAL
  Filled 2014-08-21 (×6): qty 2

## 2014-08-21 MED ORDER — ENTACAPONE 200 MG PO TABS
200.0000 mg | ORAL_TABLET | ORAL | Status: DC
  Filled 2014-08-21 (×5): qty 1

## 2014-08-21 MED ORDER — CARBIDOPA-LEVODOPA ER 25-100 MG PO TBCR
1.0000 | EXTENDED_RELEASE_TABLET | Freq: Two times a day (BID) | ORAL | Status: DC
  Filled 2014-08-21 (×3): qty 1

## 2014-08-21 MED ORDER — CITALOPRAM HYDROBROMIDE 20 MG PO TABS
20.0000 mg | ORAL_TABLET | Freq: Every day | ORAL | Status: DC
  Administered 2014-08-21: 20 mg via ORAL
  Filled 2014-08-21: qty 1

## 2014-08-21 MED ORDER — ENTACAPONE 200 MG PO TABS
200.0000 mg | ORAL_TABLET | ORAL | Status: DC
  Administered 2014-08-21: 200 mg via ORAL
  Filled 2014-08-21 (×5): qty 1

## 2014-08-21 MED ORDER — CARBIDOPA-LEVODOPA 25-100 MG PO TABS
1.0000 | ORAL_TABLET | ORAL | Status: DC
  Filled 2014-08-21 (×2): qty 1

## 2014-08-21 NOTE — ED Notes (Signed)
Will get patient vitals when he wakes up.... Nurse is aware
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
## 2014-08-21 NOTE — Progress Notes (Signed)
[facility] [phone].   Byrd HesselbachKristen Wess Baney, LCSW [phone]  ED CSW 08/21/2014 950am

## 2014-08-21 NOTE — ED Notes (Signed)
MD at bedside. 
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
## 2014-08-21 NOTE — ED Provider Notes (Signed)
CSN: [id number]     Arrival date & time 08/21/14  [id number] History   First MD Initiated Contact with Patient 08/21/14 [phone]     Chief Complaint  Patient presents with  . Altered Mental Status     (Consider location/radiation/quality/duration/timing/severity/associated sxs/prior Treatment) HPI   58 year old male with Parkinson's disease, dementia, AAA, UTI [facility] with worsening agitation. Patient to the ER bib [employer] from [facility]. The Facility sent no paperwork. The patient has severe dementia and Parkinsons disease and is unable to provider any pertinent history or information. Per [employer] report the patient has been more agitated than normal. The patient is hypertensive   Past Medical History  Diagnosis Date  . Parkinson disease   . Depression   . Alzheimer's dementia   . Anxiety   . Hyperlipidemia   . Hypertension   . Neuromuscular disorder    Past Surgical History  Procedure Laterality Date  . Cervical fusion    . Hip arthroplasty Right 01/04/2013    Procedure: ARTHROPLASTY BIPOLAR HIP;  Surgeon: Verlee RossettiSteven R Norris, MD;  Location: [facility];  Service: Orthopedics;  Laterality: Right;   Family History  Problem Relation Age of Onset  . Lung cancer Paternal Grandmother    History  Substance Use Topics  . Smoking status: Never Smoker   . Smokeless tobacco: Never Used  . Alcohol Use: No    Review of Systems  Level V- Severe Dementia  Allergies  Review of patient's allergies indicates no known allergies.  Home Medications   Prior to Admission medications   Medication Sig Start Date End Date Taking? Authorizing Provider  carbidopa-levodopa (SINEMET IR) 25-100 MG per tablet Take 2 tablets by mouth See admin instructions. 6a/10a/12p/2p/4p/6p   Yes Historical Provider, MD  carbidopa-levodopa (SINEMET IR) 25-100 MG per tablet Take 1 tablet by mouth 2 (two) times daily. 8a/8p   Yes Historical Provider, MD  carbidopa-levodopa (SINEMET IR)  25-100 MG per tablet Take 1.5 tablets by mouth at bedtime. 10p   Yes Historical Provider, MD  Cholecalciferol (VITAMIN D) 2000 UNITS tablet Take 2,000 Units by mouth every evening. At 8PM   Yes Historical Provider, MD  citalopram (CELEXA) 20 MG tablet Take 20 mg by mouth every morning.   Yes Historical Provider, MD  cyanocobalamin 500 MCG tablet Take 500 mcg by mouth daily at 12 noon.    Yes Historical Provider, MD  docusate sodium (COLACE) 100 MG capsule Take 200 mg by mouth at bedtime.   Yes Historical Provider, MD  donepezil (ARICEPT) 10 MG tablet Take 10 mg by mouth at bedtime.   Yes Historical Provider, MD  entacapone (COMTAN) 200 MG tablet Take 200 mg by mouth 5 (five) times daily. 6a/8a/10a/12p/8p   Yes Historical Provider, MD  folic acid (FOLVITE) 1 MG tablet Take 1 mg by mouth daily at 12 noon.    Yes Historical Provider, MD  meloxicam (MOBIC) 7.5 MG tablet Take 7.5 mg by mouth daily at 12 noon.   Yes Historical Provider, MD  memantine (NAMENDA) 10 MG tablet Take 10 mg by mouth 2 (two) times daily.   Yes Historical Provider, MD  Multiple Vitamin (MULTIVITAMIN WITH MINERALS) TABS tablet Take 1 tablet by mouth daily.   Yes Historical Provider, MD  rasagiline (AZILECT) 0.5 MG TABS tablet Take 0.5 mg by mouth daily.   Yes Historical Provider, MD  acetaminophen (TYLENOL) 500 MG tablet Take 500 mg by mouth every 6 (six) hours as needed for pain.  Historical Provider, MD  alendronate (FOSAMAX) 70 MG tablet Take 70 mg by mouth once a week. Take with a full glass of water on an empty stomach.    Historical Provider, MD  clonazePAM (KLONOPIN) 0.5 MG tablet Take 0.25 mg by mouth at bedtime as needed for anxiety.    Historical Provider, MD  hydrocodone-ibuprofen (VICOPROFEN) 5-200 MG per tablet Take 1 tablet by mouth every 6 (six) hours as needed for pain.    Historical Provider, MD   BP 148/116 mmHg  Pulse 98  Temp(Src) 97.6 F (36.4 C) (Oral)  Resp 18  SpO2 100% Physical Exam   Constitutional: He appears well-developed and well-nourished. No distress.  HENT:  Head: Normocephalic and atraumatic.  Eyes: Pupils are equal, round, and reactive to light.  Neck: Normal range of motion. Neck supple.  Cardiovascular: Normal rate and regular rhythm.   Pulmonary/Chest: Effort normal and breath sounds normal.  Abdominal: Soft. Bowel sounds are normal. He exhibits no distension. There is no tenderness.  Neurological: He is alert.  Pt will follow simple commands Normal muscular tone No facial droop finger-nose-finger normal  Skin: Skin is warm and dry.  Nursing note and vitals reviewed.   ED Course  Procedures (including critical care time) Labs Review Labs Reviewed  BASIC METABOLIC PANEL - Abnormal; Notable for the following:    Glucose, Bld 108 (*)    All other components within normal limits  URINALYSIS, ROUTINE W REFLEX MICROSCOPIC  CBC WITH DIFFERENTIAL  I-STAT TROPOININ, ED    Imaging Review Dg Chest 2 View  08/21/2014   CLINICAL DATA:  Altered mental status  EXAM: CHEST  2 VIEW  COMPARISON:  August 23, 2012  FINDINGS: Lungs are clear. Heart size and pulmonary vascularity are normal. No adenopathy. There is postoperative change in the lower cervical spine. There is upper thoracic levoscoliosis.  IMPRESSION: No edema or consolidation.   Electronically Signed   By: Bretta BangWilliam  Woodruff M.D.   On: 08/21/2014 07:20     EKG Interpretation   Date/Time:  Monday August 21 2014 06:46:20 EST Ventricular Rate:  88 PR Interval:  167 QRS Duration: 84 QT Interval:  371 QTC Calculation: 449 R Axis:   36 Text Interpretation:  Sinus rhythm Sinus rhythm Normal ECG Confirmed by  Gerhard MunchLOCKWOOD, ROBERT  MD ([id number]) on 08/21/2014 10:43:47 AM      MDM   Final diagnoses:  Altered mental status   8:19am Patient work-up has been unremarkable and he has been calm and cooperative in the ED today with myself and staff.  He does not have UTI, metabolic abnormality, pneumonia.  He has previous visits to the ED after transient episodes of worsening dementia/agitiation.  I have consulted social work to speak with facility to gain more information and to see if patient will be allowed to go back. At this time, no indication for admission is present.   11: 36 pm I spoke with Social work and Product managerDonna at [facility]. They have both also communicated together. Social work is working on getting the patient back over to [facility] and has adressed Donna's concerns. Will work on getting close psychiatric outpatient f/u for him. Pt has been cooperative and easy to redirect in the ED. Pt has [facility]. SW advised Lupita LeashDonna that they can bring him to the ED if more safety concerns present. At this time a car from the facility is coming to get pt. Will DC at 12: 15 pm  58  y.o.Larry Daniel's evaluation in the Emergency Department is complete. It has been determined that no acute conditions requiring further emergency intervention are present at this time. The patient/guardian have been advised of the diagnosis and plan. We have discussed signs and symptoms that warrant return to the ED, such as changes or worsening in symptoms.  Vital signs are stable at discharge. Filed Vitals:   08/21/14 0856  BP: 148/116  Pulse: 98  Temp:   Resp: 18    Patient/guardian has voiced understanding and agreed to follow-up with the PCP or specialist.   Dorthula Matas, PA-C 08/21/14 1141

## 2014-08-21 NOTE — ED Notes (Signed)
Patient having flight of ideas upon entering room. Patient reports bilateral wrist pain, denies N/V, c/o "tired all over". Patient calm and cooperative at this time.

## 2014-08-21 NOTE — ED Notes (Signed)
Pt got out of bed, pulled IV out of arm and was pulling off heart monitor. Pt put back in bed and bed alarm put on bed.

## 2014-10-05 ENCOUNTER — Encounter (HOSPITAL_COMMUNITY): Payer: Self-pay | Admitting: Emergency Medicine

## 2014-10-05 ENCOUNTER — Emergency Department (HOSPITAL_COMMUNITY)
Admission: EM | Admit: 2014-10-05 | Discharge: 2014-10-05 | Disposition: A | Discharge status: Home / Self Care | Payer: Medicare Other | Attending: Emergency Medicine | Admitting: Emergency Medicine

## 2014-10-05 DIAGNOSIS — Z79899 Other long term (current) drug therapy: Secondary | ICD-10-CM | POA: Diagnosis not present

## 2014-10-05 DIAGNOSIS — G20 Parkinson's disease: Secondary | ICD-10-CM | POA: Diagnosis not present

## 2014-10-05 DIAGNOSIS — F329 Major depressive disorder, single episode, unspecified: Secondary | ICD-10-CM | POA: Insufficient documentation

## 2014-10-05 DIAGNOSIS — F419 Anxiety disorder, unspecified: Secondary | ICD-10-CM | POA: Insufficient documentation

## 2014-10-05 DIAGNOSIS — F0281 Dementia in other diseases classified elsewhere with behavioral disturbance: Secondary | ICD-10-CM | POA: Diagnosis not present

## 2014-10-05 DIAGNOSIS — Z791 Long term (current) use of non-steroidal anti-inflammatories (NSAID): Secondary | ICD-10-CM | POA: Insufficient documentation

## 2014-10-05 DIAGNOSIS — F0391 Unspecified dementia with behavioral disturbance: Secondary | ICD-10-CM

## 2014-10-05 DIAGNOSIS — G309 Alzheimer's disease, unspecified: Secondary | ICD-10-CM | POA: Insufficient documentation

## 2014-10-05 DIAGNOSIS — I10 Essential (primary) hypertension: Secondary | ICD-10-CM | POA: Diagnosis not present

## 2014-10-05 DIAGNOSIS — F03918 Unspecified dementia, unspecified severity, with other behavioral disturbance: Secondary | ICD-10-CM

## 2014-10-05 DIAGNOSIS — R451 Restlessness and agitation: Secondary | ICD-10-CM | POA: Diagnosis present

## 2014-10-05 DIAGNOSIS — Z8639 Personal history of other endocrine, nutritional and metabolic disease: Secondary | ICD-10-CM | POA: Diagnosis not present

## 2014-10-05 LAB — CBC
HCT: 42.5 % (ref 39.0–52.0)
HEMOGLOBIN: 14.1 g/dL (ref 13.0–17.0)
MCH: 30.5 pg (ref 26.0–34.0)
MCHC: 33.2 g/dL (ref 30.0–36.0)
MCV: 91.8 fL (ref 78.0–100.0)
Platelets: 166 10*3/uL (ref 150–400)
RBC: 4.63 MIL/uL (ref 4.22–5.81)
RDW: 12.5 % (ref 11.5–15.5)
WBC: 7.9 10*3/uL (ref 4.0–10.5)

## 2014-10-05 LAB — URINALYSIS, ROUTINE W REFLEX MICROSCOPIC
Bilirubin Urine: NEGATIVE
Glucose, UA: NEGATIVE mg/dL
HGB URINE DIPSTICK: NEGATIVE
Ketones, ur: NEGATIVE mg/dL
LEUKOCYTES UA: NEGATIVE
Nitrite: NEGATIVE
PROTEIN: NEGATIVE mg/dL
Specific Gravity, Urine: 1.027 (ref 1.005–1.030)
UROBILINOGEN UA: 0.2 mg/dL (ref 0.0–1.0)
pH: 6.5 (ref 5.0–8.0)

## 2014-10-05 LAB — COMPREHENSIVE METABOLIC PANEL
ALT: 6 U/L (ref 0–53)
AST: 17 U/L (ref 0–37)
Albumin: 3.8 g/dL (ref 3.5–5.2)
Alkaline Phosphatase: 65 U/L (ref 39–117)
Anion gap: 12 (ref 5–15)
BUN: 21 mg/dL (ref 6–23)
CALCIUM: 9 mg/dL (ref 8.4–10.5)
CO2: 25 meq/L (ref 19–32)
Chloride: 103 mEq/L (ref 96–112)
Creatinine, Ser: 0.89 mg/dL (ref 0.50–1.35)
GFR calc non Af Amer: >90 mL/min (ref >90)
GLUCOSE: 122 mg/dL — AB (ref 70–99)
Potassium: 3.9 mEq/L (ref 3.7–5.3)
Sodium: 140 mEq/L (ref 137–147)
Total Bilirubin: 0.8 mg/dL (ref 0.3–1.2)
Total Protein: 6.6 g/dL (ref 6.0–8.3)

## 2014-10-05 NOTE — ED Notes (Signed)
Pt is a poor historian and is having trouble verbalizing what happened. Called [facility] for clarification on pt baseline and what occurred. Spoke with McCallaHabeeb, NT. He stated that the pt sometimes becomes aggressive and is given Seroquel to calm him down. Tonight the Seroquel was ineffective. Sent pt here for evaluation. Also stated that the pt has hx of Parkinsons and has periods where he is unable to clearly verbalize his thoughts.

## 2014-10-05 NOTE — ED Provider Notes (Signed)
CSN: [id number]     Arrival date & time 10/05/14  0055 History   First MD Initiated Contact with Patient 10/05/14 0133     Chief Complaint  Patient presents with  . Agitation     (Consider location/radiation/quality/duration/timing/severity/associated sxs/prior Treatment) The history is provided by the patient and the EMS personnel. The history is limited by the condition of the patient.  pt with hx parkinsons, dementia, w agitated behavior at ecf.  Per report, normally pt will be given his medication w improvement in symptoms, but tonight symptoms had persisted. On arrival to ED pt calm and alert. Pt denies any c/o on arrival to ED, and is limited historian - level 5 caveat.  No report of fevers. No noted change in medication. No report of trauma or fall.       Past Medical History  Diagnosis Date  . Parkinson disease   . Depression   . Alzheimer's dementia   . Anxiety   . Hyperlipidemia   . Hypertension   . Neuromuscular disorder    Past Surgical History  Procedure Laterality Date  . Cervical fusion    . Hip arthroplasty Right 01/04/2013    Procedure: ARTHROPLASTY BIPOLAR HIP;  Surgeon: Verlee Rossetti, MD;  Location: [facility];  Service: Orthopedics;  Laterality: Right;   Family History  Problem Relation Age of Onset  . Lung cancer Paternal Grandmother    History  Substance Use Topics  . Smoking status: Never Smoker   . Smokeless tobacco: Never Used  . Alcohol Use: No      Review of Systems  Unable to perform ROS: Psychiatric disorder  Constitutional: Negative for fever and chills.  Respiratory: Negative for shortness of breath.   Cardiovascular: Negative for chest pain.  Gastrointestinal: Negative for abdominal pain.  Neurological: Negative for headaches.  limited ros, level 5 caveat, dementia/psych      Allergies  Review of patient's allergies indicates no known allergies.  Home Medications   Prior to Admission medications   Medication Sig Start  Date End Date Taking? Authorizing Provider  acetaminophen (TYLENOL) 500 MG tablet Take 500 mg by mouth every 6 (six) hours as needed for pain.   Yes Historical Provider, MD  alendronate (FOSAMAX) 70 MG tablet Take 70 mg by mouth once a week. Take with a full glass of water on an empty stomach.   Yes Historical Provider, MD  carbidopa-levodopa (SINEMET IR) 25-100 MG per tablet Take 2 tablets by mouth See admin instructions. 6a/10a/12p/2p/4p/6p   Yes Historical Provider, MD  carbidopa-levodopa (SINEMET IR) 25-100 MG per tablet Take 1 tablet by mouth 2 (two) times daily. 8a/8p   Yes Historical Provider, MD  carbidopa-levodopa (SINEMET IR) 25-100 MG per tablet Take 1.5 tablets by mouth at bedtime. 10p   Yes Historical Provider, MD  Cholecalciferol (VITAMIN D) 2000 UNITS tablet Take 2,000 Units by mouth every evening. At 8PM   Yes Historical Provider, MD  citalopram (CELEXA) 20 MG tablet Take 20 mg by mouth every morning.   Yes Historical Provider, MD  clonazePAM (KLONOPIN) 0.5 MG tablet Take 0.25 mg by mouth at bedtime as needed for anxiety.   Yes Historical Provider, MD  cyanocobalamin 500 MCG tablet Take 500 mcg by mouth daily at 12 noon.    Yes Historical Provider, MD  docusate sodium (COLACE) 100 MG capsule Take 200 mg by mouth at bedtime.   Yes Historical Provider, MD  donepezil (ARICEPT) 10 MG tablet Take 10 mg by mouth at bedtime.  Yes Historical Provider, MD  entacapone (COMTAN) 200 MG tablet Take 200 mg by mouth 5 (five) times daily. 6a/8a/10a/12p/8p   Yes Historical Provider, MD  folic acid (FOLVITE) 1 MG tablet Take 1 mg by mouth daily at 12 noon.    Yes Historical Provider, MD  meloxicam (MOBIC) 7.5 MG tablet Take 7.5 mg by mouth daily at 12 noon.   Yes Historical Provider, MD  memantine (NAMENDA) 10 MG tablet Take 10 mg by mouth 2 (two) times daily.   Yes Historical Provider, MD  methocarbamol (ROBAXIN) 500 MG tablet Take 500 mg by mouth at bedtime as needed for muscle spasms.   Yes  Historical Provider, MD  Multiple Vitamin (MULTIVITAMIN WITH MINERALS) TABS tablet Take 1 tablet by mouth daily.   Yes Historical Provider, MD  hydrocodone-ibuprofen (VICOPROFEN) 5-200 MG per tablet Take 1 tablet by mouth every 6 (six) hours as needed for pain.    Historical Provider, MD  rasagiline (AZILECT) 0.5 MG TABS tablet Take 0.5 mg by mouth daily.    Historical Provider, MD   BP 152/95 mmHg  Pulse 93  Temp(Src) 98.3 F (36.8 C) (Oral)  Resp 18  SpO2 97% Physical Exam  Constitutional: He appears well-developed and well-nourished. No distress.  HENT:  Head: Atraumatic.  Mouth/Throat: Oropharynx is clear and moist.  Eyes: Conjunctivae are normal. Pupils are equal, round, and reactive to light. No scleral icterus.  Neck: Normal range of motion. Neck supple. No tracheal deviation present.  No stiffness or rigidity  Cardiovascular: Normal rate, regular rhythm, normal heart sounds and intact distal pulses.  Exam reveals no gallop and no friction rub.   No murmur heard. Pulmonary/Chest: Effort normal and breath sounds normal. No accessory muscle usage. No respiratory distress.  Abdominal: Soft. Bowel sounds are normal. He exhibits no distension. There is no tenderness.  Genitourinary:  No cva tenderness  Musculoskeletal: Normal range of motion. He exhibits no edema or tenderness.  Neurological: He is alert.  Calm, awake, alert. Moves bil ext purposefully w good strength bil.  Skin: Skin is warm and dry. No rash noted. He is not diaphoretic.  Psychiatric:  Calm, content.   Nursing note and vitals reviewed.   ED Course  Procedures (including critical care time) Labs Review   Results for orders placed or performed during the hospital encounter of 10/05/14  CBC  Result Value Ref Range   WBC 7.9 4.0 - 10.5 K/uL   RBC 4.63 4.22 - 5.81 MIL/uL   Hemoglobin 14.1 13.0 - 17.0 g/dL   HCT 16.142.5 09.639.0 - 04.552.0 %   MCV 91.8 78.0 - 100.0 fL   MCH 30.5 26.0 - 34.0 pg   MCHC 33.2 30.0 -  36.0 g/dL   RDW 40.912.5 81.111.5 - 91.415.5 %   Platelets 166 150 - 400 K/uL  Comprehensive metabolic panel  Result Value Ref Range   Sodium 140 137 - 147 mEq/L   Potassium 3.9 3.7 - 5.3 mEq/L   Chloride 103 96 - 112 mEq/L   CO2 25 19 - 32 mEq/L   Glucose, Bld 122 (H) 70 - 99 mg/dL   BUN 21 6 - 23 mg/dL   Creatinine, Ser 7.820.89 0.50 - 1.35 mg/dL   Calcium 9.0 8.4 - 95.610.5 mg/dL   Total Protein 6.6 6.0 - 8.3 g/dL   Albumin 3.8 3.5 - 5.2 g/dL   AST 17 0 - 37 U/L   ALT 6 0 - 53 U/L   Alkaline Phosphatase 65 39 - 117 U/L  Total Bilirubin 0.8 0.3 - 1.2 mg/dL   GFR calc non Af Amer >90 >90 mL/min   GFR calc Af Amer >90 >90 mL/min   Anion gap 12 5 - 15  Urinalysis, Routine w reflex microscopic  Result Value Ref Range   Color, Urine YELLOW YELLOW   APPearance CLEAR CLEAR   Specific Gravity, Urine 1.027 1.005 - 1.030   pH 6.5 5.0 - 8.0   Glucose, UA NEGATIVE NEGATIVE mg/dL   Hgb urine dipstick NEGATIVE NEGATIVE   Bilirubin Urine NEGATIVE NEGATIVE   Ketones, ur NEGATIVE NEGATIVE mg/dL   Protein, ur NEGATIVE NEGATIVE mg/dL   Urobilinogen, UA 0.2 0.0 - 1.0 mg/dL   Nitrite NEGATIVE NEGATIVE   Leukocytes, UA NEGATIVE NEGATIVE      MDM   Labs.  Reviewed nursing notes and prior charts for additional history.   On review prior charts, previous ED visits w similar symptoms.   Pt w parkinsons, dementia, w ?periods agitated behavior in past.  Pt calm and alert. Denies any c/o.  Pt resting quietly, nad.  Pt appear stable for d/c.    Suzi RootsKevin E Darean Rote, MD 10/05/14 [phone]

## 2014-10-05 NOTE — ED Notes (Signed)
[facility] called for transport.  

## 2014-10-05 NOTE — ED Notes (Signed)
[facility]. Staff states pt became agitated around 2330. Seroquel PO administered at 2355. Medication ineffective pt became aggressive and staff became scared and sent pt to ED. Pt has not been aggressive with EMS. Pt is alert and oriented to baseline.

## 2014-10-05 NOTE — Discharge Instructions (Signed)
It was our pleasure to provide your ER care today - we hope that you feel better.  Follow up with your doctor in the next 1-2 days - discuss possible medication adjustments with primary care doctor.  If behavioral symptoms, discuss with primary care doctor, and possible medication adjustments.  Return to ER if worse, new symptoms, fevers, medical emergency, other concern.

## 2014-10-05 NOTE — ED Notes (Signed)
Bed: [id number] Expected date: 10/05/14 Expected time: 12:44 AM Means of arrival: Ambulance Comments: 60s behavioral issues

## 2014-11-15 ENCOUNTER — Encounter (HOSPITAL_COMMUNITY): Payer: Self-pay

## 2014-11-15 ENCOUNTER — Emergency Department (HOSPITAL_COMMUNITY)
Admission: EM | Admit: 2014-11-15 | Discharge: 2014-11-16 | Payer: Medicare Other | Attending: Emergency Medicine | Admitting: Emergency Medicine

## 2014-11-15 DIAGNOSIS — G20 Parkinson's disease: Secondary | ICD-10-CM | POA: Diagnosis not present

## 2014-11-15 DIAGNOSIS — G309 Alzheimer's disease, unspecified: Secondary | ICD-10-CM | POA: Insufficient documentation

## 2014-11-15 DIAGNOSIS — F39 Unspecified mood [affective] disorder: Secondary | ICD-10-CM | POA: Diagnosis present

## 2014-11-15 DIAGNOSIS — Z79899 Other long term (current) drug therapy: Secondary | ICD-10-CM | POA: Insufficient documentation

## 2014-11-15 DIAGNOSIS — F0391 Unspecified dementia with behavioral disturbance: Secondary | ICD-10-CM | POA: Diagnosis present

## 2014-11-15 DIAGNOSIS — E785 Hyperlipidemia, unspecified: Secondary | ICD-10-CM | POA: Insufficient documentation

## 2014-11-15 DIAGNOSIS — I10 Essential (primary) hypertension: Secondary | ICD-10-CM | POA: Insufficient documentation

## 2014-11-15 DIAGNOSIS — F03918 Unspecified dementia, unspecified severity, with other behavioral disturbance: Secondary | ICD-10-CM | POA: Diagnosis present

## 2014-11-15 DIAGNOSIS — R451 Restlessness and agitation: Secondary | ICD-10-CM | POA: Diagnosis not present

## 2014-11-15 DIAGNOSIS — Z046 Encounter for general psychiatric examination, requested by authority: Secondary | ICD-10-CM | POA: Diagnosis present

## 2014-11-15 LAB — URINALYSIS, ROUTINE W REFLEX MICROSCOPIC
BILIRUBIN URINE: NEGATIVE
GLUCOSE, UA: NEGATIVE mg/dL
Hgb urine dipstick: NEGATIVE
Ketones, ur: 40 mg/dL — AB
LEUKOCYTES UA: NEGATIVE
Nitrite: NEGATIVE
PH: 6 (ref 5.0–8.0)
Protein, ur: NEGATIVE mg/dL
SPECIFIC GRAVITY, URINE: 1.03 (ref 1.005–1.030)
Urobilinogen, UA: 0.2 mg/dL (ref 0.0–1.0)

## 2014-11-15 LAB — COMPREHENSIVE METABOLIC PANEL
ALBUMIN: 3.6 g/dL (ref 3.5–5.2)
ALK PHOS: 68 U/L (ref 39–117)
AST: 15 U/L (ref 0–37)
Anion gap: 6 (ref 5–15)
BILIRUBIN TOTAL: 0.8 mg/dL (ref 0.3–1.2)
BUN: 14 mg/dL (ref 6–23)
CALCIUM: 9.2 mg/dL (ref 8.4–10.5)
CHLORIDE: 104 mmol/L (ref 96–112)
CO2: 29 mmol/L (ref 19–32)
CREATININE: 0.88 mg/dL (ref 0.50–1.35)
Glucose, Bld: 100 mg/dL — ABNORMAL HIGH (ref 70–99)
Potassium: 4.2 mmol/L (ref 3.5–5.1)
Sodium: 139 mmol/L (ref 135–145)
Total Protein: 6.8 g/dL (ref 6.0–8.3)

## 2014-11-15 LAB — CBC
HCT: 42.6 % (ref 39.0–52.0)
Hemoglobin: 14.3 g/dL (ref 13.0–17.0)
MCH: 30.8 pg (ref 26.0–34.0)
MCHC: 33.6 g/dL (ref 30.0–36.0)
MCV: 91.8 fL (ref 78.0–100.0)
Platelets: 222 10*3/uL (ref 150–400)
RBC: 4.64 MIL/uL (ref 4.22–5.81)
RDW: 12.1 % (ref 11.5–15.5)
WBC: 7 10*3/uL (ref 4.0–10.5)

## 2014-11-15 LAB — RAPID URINE DRUG SCREEN, HOSP PERFORMED
Amphetamines: NOT DETECTED
BARBITURATES: NOT DETECTED
BENZODIAZEPINES: NOT DETECTED
Cocaine: NOT DETECTED
OPIATES: NOT DETECTED
Tetrahydrocannabinol: NOT DETECTED

## 2014-11-15 LAB — SALICYLATE LEVEL

## 2014-11-15 LAB — ACETAMINOPHEN LEVEL: Acetaminophen (Tylenol), Serum: <10 ug/mL — ABNORMAL LOW (ref 10–30)

## 2014-11-15 LAB — ETHANOL

## 2014-11-15 MED ORDER — FOLIC ACID 1 MG PO TABS
1.0000 mg | ORAL_TABLET | Freq: Every day | ORAL | Status: DC
  Administered 2014-11-16: 1 mg via ORAL
  Filled 2014-11-15: qty 1

## 2014-11-15 MED ORDER — ALENDRONATE SODIUM 70 MG PO TABS: 70.0000 mg | ORAL_TABLET | ORAL | Status: DC

## 2014-11-15 MED ORDER — MELOXICAM 7.5 MG PO TABS
7.5000 mg | ORAL_TABLET | Freq: Two times a day (BID) | ORAL | Status: DC | PRN
  Filled 2014-11-15: qty 1

## 2014-11-15 MED ORDER — ENTACAPONE 200 MG PO TABS
200.0000 mg | ORAL_TABLET | ORAL | Status: DC
  Administered 2014-11-15 – 2014-11-16 (×4): 200 mg via ORAL
  Filled 2014-11-15 (×8): qty 1

## 2014-11-15 MED ORDER — MEMANTINE HCL 10 MG PO TABS
10.0000 mg | ORAL_TABLET | Freq: Two times a day (BID) | ORAL | Status: DC
  Administered 2014-11-15 – 2014-11-16 (×2): 10 mg via ORAL
  Filled 2014-11-15 (×3): qty 1

## 2014-11-15 MED ORDER — ACETAMINOPHEN 500 MG PO TABS: 500.0000 mg | ORAL_TABLET | Freq: Three times a day (TID) | ORAL | Status: DC | PRN

## 2014-11-15 MED ORDER — METHOCARBAMOL 500 MG PO TABS: 500.0000 mg | ORAL_TABLET | Freq: Every evening | ORAL | Status: DC | PRN

## 2014-11-15 MED ORDER — CYANOCOBALAMIN 500 MCG PO TABS
500.0000 ug | ORAL_TABLET | Freq: Every day | ORAL | Status: DC
  Administered 2014-11-16: 500 ug via ORAL
  Filled 2014-11-15 (×3): qty 1

## 2014-11-15 MED ORDER — VITAMIN D3 25 MCG (1000 UNIT) PO TABS
2000.0000 [IU] | ORAL_TABLET | Freq: Every day | ORAL | Status: DC
  Administered 2014-11-16: 2000 [IU] via ORAL
  Filled 2014-11-15 (×2): qty 2

## 2014-11-15 MED ORDER — QUETIAPINE FUMARATE 50 MG PO TABS
50.0000 mg | ORAL_TABLET | Freq: Two times a day (BID) | ORAL | Status: DC | PRN
  Administered 2014-11-15 – 2014-11-16 (×2): 50 mg via ORAL
  Filled 2014-11-15 (×2): qty 1

## 2014-11-15 MED ORDER — ADULT MULTIVITAMIN W/MINERALS CH
1.0000 | ORAL_TABLET | Freq: Every day | ORAL | Status: DC
  Administered 2014-11-16: 1 via ORAL
  Filled 2014-11-15: qty 1

## 2014-11-15 MED ORDER — CLONAZEPAM 0.5 MG PO TABS
0.2500 mg | ORAL_TABLET | Freq: Every day | ORAL | Status: DC | PRN
  Administered 2014-11-16 (×2): 0.25 mg via ORAL
  Filled 2014-11-15 (×2): qty 1

## 2014-11-15 MED ORDER — DOCUSATE SODIUM 100 MG PO CAPS
200.0000 mg | ORAL_CAPSULE | Freq: Every day | ORAL | Status: DC
  Administered 2014-11-15: 200 mg via ORAL
  Filled 2014-11-15: qty 2

## 2014-11-15 MED ORDER — CARBIDOPA-LEVODOPA 25-100 MG PO TABS
1.0000 | ORAL_TABLET | Freq: Two times a day (BID) | ORAL | Status: DC
  Administered 2014-11-15 – 2014-11-16 (×2): 1 via ORAL
  Filled 2014-11-15 (×4): qty 1

## 2014-11-15 MED ORDER — CARBIDOPA-LEVODOPA 25-100 MG PO TABS
1.5000 | ORAL_TABLET | Freq: Every day | ORAL | Status: DC
  Administered 2014-11-15: 1.5 via ORAL
  Filled 2014-11-15 (×2): qty 1.5

## 2014-11-15 MED ORDER — CITALOPRAM HYDROBROMIDE 20 MG PO TABS
20.0000 mg | ORAL_TABLET | Freq: Every day | ORAL | Status: DC
  Administered 2014-11-16: 20 mg via ORAL
  Filled 2014-11-15: qty 1

## 2014-11-15 MED ORDER — CARBIDOPA-LEVODOPA 25-100 MG PO TABS
2.0000 | ORAL_TABLET | ORAL | Status: DC
  Administered 2014-11-15 – 2014-11-16 (×4): 2 via ORAL
  Filled 2014-11-15 (×9): qty 2

## 2014-11-15 MED ORDER — DONEPEZIL HCL 10 MG PO TABS
10.0000 mg | ORAL_TABLET | Freq: Every day | ORAL | Status: DC
  Administered 2014-11-16: 10 mg via ORAL
  Filled 2014-11-15 (×2): qty 1

## 2014-11-15 NOTE — ED Notes (Signed)
Patient wandering in hallway. Patient redirected into room and given coloring pages and crayons

## 2014-11-15 NOTE — ED Notes (Signed)
Patient wandering in hallway

## 2014-11-15 NOTE — ED Notes (Signed)
Patient resting in bed, eyes closed, even rise and fall of chest. Sitter at bedside at this time for patient safety.

## 2014-11-15 NOTE — ED Notes (Signed)
Patient pacing in room and into hallway.

## 2014-11-15 NOTE — ED Notes (Signed)
Bed: [facility]3 Expected date:  Expected time:  Means of arrival:  Comments: IVC 

## 2014-11-15 NOTE — ED Notes (Signed)
Patient resting in recliner chair at this time. No needs voiced

## 2014-11-15 NOTE — ED Notes (Signed)
Patient given graham crackers at request

## 2014-11-15 NOTE — ED Notes (Signed)
Pt unable to void at this time. 
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
## 2014-11-15 NOTE — ED Notes (Addendum)
Patient resting in bed, eyes closed. Even rise and fall of chest. NAD noted at this time.
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
## 2014-11-16 DIAGNOSIS — R451 Restlessness and agitation: Secondary | ICD-10-CM | POA: Diagnosis not present

## 2014-11-16 DIAGNOSIS — F03918 Unspecified dementia, unspecified severity, with other behavioral disturbance: Secondary | ICD-10-CM | POA: Diagnosis present

## 2014-11-16 DIAGNOSIS — F39 Unspecified mood [affective] disorder: Secondary | ICD-10-CM | POA: Diagnosis present

## 2014-11-16 DIAGNOSIS — F0391 Unspecified dementia with behavioral disturbance: Secondary | ICD-10-CM

## 2014-11-16 MED ORDER — QUETIAPINE FUMARATE 50 MG PO TABS: 50.0000 mg | ORAL_TABLET | Freq: Two times a day (BID) | ORAL | Status: DC

## 2014-11-16 MED ORDER — HALOPERIDOL LACTATE 5 MG/ML IJ SOLN
INTRAMUSCULAR | Status: AC
  Administered 2014-11-16: 10:00:00
  Filled 2014-11-16: qty 1

## 2014-11-16 MED ORDER — LORAZEPAM 1 MG PO TABS: 1.0000 mg | ORAL_TABLET | Freq: Four times a day (QID) | ORAL | Status: DC | PRN

## 2014-11-16 MED ORDER — LORAZEPAM 2 MG/ML IJ SOLN
1.0000 mg | Freq: Once | INTRAMUSCULAR | Status: AC
  Administered 2014-11-16: 1 mg via INTRAMUSCULAR

## 2014-11-16 MED ORDER — LORAZEPAM 2 MG/ML IJ SOLN
INTRAMUSCULAR | Status: AC
  Filled 2014-11-16: qty 1

## 2014-11-16 MED ORDER — DIPHENHYDRAMINE HCL 50 MG/ML IJ SOLN
25.0000 mg | Freq: Once | INTRAMUSCULAR | Status: AC
  Administered 2014-11-16: 25 mg via INTRAMUSCULAR
  Filled 2014-11-16: qty 1

## 2014-11-16 MED ORDER — HALOPERIDOL LACTATE 5 MG/ML IJ SOLN: 5.0000 mg | Freq: Once | INTRAMUSCULAR | Status: AC
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
## 2014-11-16 NOTE — ED Notes (Signed)
Patient lying in bed at this time, eyes closed, even rise and fall of chest. NAD noted at this time.

## 2014-11-16 NOTE — ED Notes (Signed)
Patient resting in bed at this time. Eyes closed, even rise and fall of chest. NAD noted at this time. 
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
## 2014-11-16 NOTE — BH Assessment (Signed)
[facility] Assessment Progress Note  At 12:22 [name].  Pt has been accepted to their facility by Dr Leonette MonarchHarman to Rm [phone].  Please call report to [phone].  Pt is under IVC and will be transported by [employer].  Larry ByesJosephine Onuoha, NP, as well as pt's nurse, Larry LankJacqueline, have been informed.  Larry Canninghomas Tyja Gortney, MA Triage Specialist 11/16/2014 @ 12:33

## 2014-11-16 NOTE — ED Notes (Signed)
Patient resting in bed at this time, safety sitter at bedside. NAD noted
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
## 2014-11-16 NOTE — ED Notes (Signed)
[facility]. Sheriff called for transport to Facility.

## 2014-11-16 NOTE — ED Notes (Signed)
Patient resting in bed, eyes closed, even rise and fall of chest. NAD noted. 

## 2014-11-16 NOTE — ED Notes (Signed)
Patient ambulatory to restroom with assistance

## 2014-11-16 NOTE — ED Notes (Signed)
Patient resting in bed, eyes closed, even rise and fall of chest, NAD noted.
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
## 2014-11-16 NOTE — ED Notes (Signed)
Patient restless in bed at this time, redirected patient back to bed

## 2014-11-17 LAB — URINE CULTURE
Colony Count: NO GROWTH
Culture: NO GROWTH

## 2015-02-21 ENCOUNTER — Emergency Department (HOSPITAL_COMMUNITY): Payer: Medicare Other

## 2015-02-21 ENCOUNTER — Encounter (HOSPITAL_COMMUNITY): Payer: Self-pay | Admitting: Emergency Medicine

## 2015-02-21 ENCOUNTER — Emergency Department (HOSPITAL_COMMUNITY)
Admission: EM | Admit: 2015-02-21 | Discharge: 2015-02-22 | Disposition: A | Discharge status: Home / Self Care | Payer: Medicare Other | Attending: Emergency Medicine | Admitting: Emergency Medicine

## 2015-02-21 DIAGNOSIS — S79912A Unspecified injury of left hip, initial encounter: Secondary | ICD-10-CM | POA: Diagnosis not present

## 2015-02-21 DIAGNOSIS — Z8639 Personal history of other endocrine, nutritional and metabolic disease: Secondary | ICD-10-CM | POA: Insufficient documentation

## 2015-02-21 DIAGNOSIS — S8992XA Unspecified injury of left lower leg, initial encounter: Secondary | ICD-10-CM | POA: Insufficient documentation

## 2015-02-21 DIAGNOSIS — I10 Essential (primary) hypertension: Secondary | ICD-10-CM | POA: Diagnosis not present

## 2015-02-21 DIAGNOSIS — F329 Major depressive disorder, single episode, unspecified: Secondary | ICD-10-CM | POA: Insufficient documentation

## 2015-02-21 DIAGNOSIS — S0990XA Unspecified injury of head, initial encounter: Secondary | ICD-10-CM | POA: Diagnosis present

## 2015-02-21 DIAGNOSIS — G309 Alzheimer's disease, unspecified: Secondary | ICD-10-CM | POA: Diagnosis not present

## 2015-02-21 DIAGNOSIS — F419 Anxiety disorder, unspecified: Secondary | ICD-10-CM | POA: Insufficient documentation

## 2015-02-21 DIAGNOSIS — M25552 Pain in left hip: Secondary | ICD-10-CM | POA: Diagnosis not present

## 2015-02-21 DIAGNOSIS — F0280 Dementia in other diseases classified elsewhere without behavioral disturbance: Secondary | ICD-10-CM | POA: Diagnosis not present

## 2015-02-21 DIAGNOSIS — W19XXXA Unspecified fall, initial encounter: Secondary | ICD-10-CM

## 2015-02-21 DIAGNOSIS — Z79899 Other long term (current) drug therapy: Secondary | ICD-10-CM | POA: Insufficient documentation

## 2015-02-21 DIAGNOSIS — Y999 Unspecified external cause status: Secondary | ICD-10-CM | POA: Diagnosis not present

## 2015-02-21 DIAGNOSIS — Y929 Unspecified place or not applicable: Secondary | ICD-10-CM | POA: Diagnosis not present

## 2015-02-21 DIAGNOSIS — S0081XA Abrasion of other part of head, initial encounter: Secondary | ICD-10-CM | POA: Insufficient documentation

## 2015-02-21 DIAGNOSIS — W01198A Fall on same level from slipping, tripping and stumbling with subsequent striking against other object, initial encounter: Secondary | ICD-10-CM | POA: Insufficient documentation

## 2015-02-21 DIAGNOSIS — G20 Parkinson's disease: Secondary | ICD-10-CM | POA: Insufficient documentation

## 2015-02-21 DIAGNOSIS — Y939 Activity, unspecified: Secondary | ICD-10-CM | POA: Insufficient documentation

## 2015-02-21 LAB — I-STAT CHEM 8, ED
BUN: 25 mg/dL — AB (ref 6–20)
CALCIUM ION: 1.09 mmol/L — AB (ref 1.13–1.30)
CHLORIDE: 100 mmol/L — AB (ref 101–111)
Creatinine, Ser: 0.9 mg/dL (ref 0.61–1.24)
GLUCOSE: 131 mg/dL — AB (ref 70–99)
HCT: 40 % (ref 39.0–52.0)
Hemoglobin: 13.6 g/dL (ref 13.0–17.0)
Potassium: 3.6 mmol/L (ref 3.5–5.1)
Sodium: 138 mmol/L (ref 135–145)
TCO2: 22 mmol/L (ref 0–100)

## 2015-02-21 NOTE — ED Provider Notes (Signed)
CSN: [id number]     Arrival date & time 02/21/15  1933 History   First MD Initiated Contact with Patient 02/21/15 1959     Chief Complaint  Patient presents with  . Fall     (Consider location/radiation/quality/duration/timing/severity/associated sxs/prior Treatment) HPI Level 5 caveat dementia. History is obtained from Velvet BatheJonathan Farmer, medical technician at assisted-living facility. Patient suffered unwitnessed fall 6:30 PM today. He complained of left hip pain and knee pain since the event. He struck his head against the refrigerator as a result fall. No treatment prior to coming here. Patient speech is presently incomprehensible which Mr. Jimmye NormanFarmer states is baseline for him. Past Medical History  Diagnosis Date  . Parkinson disease   . Depression   . Alzheimer's dementia   . Anxiety   . Hyperlipidemia   . Hypertension   . Neuromuscular disorder    Past Surgical History  Procedure Laterality Date  . Cervical fusion    . Hip arthroplasty Right 01/04/2013    Procedure: ARTHROPLASTY BIPOLAR HIP;  Surgeon: Verlee RossettiSteven R Norris, MD;  Location: [facility];  Service: Orthopedics;  Laterality: Right;   Family History  Problem Relation Age of Onset  . Lung cancer Paternal Grandmother    History  Substance Use Topics  . Smoking status: Never Smoker   . Smokeless tobacco: Never Used  . Alcohol Use: No    Review of Systems  Unable to perform ROS Musculoskeletal: Positive for arthralgias.  Neurological: Positive for speech difficulty.   Unable to perform complete review of systems dementia   Allergies  Review of patient's allergies indicates no known allergies.  Home Medications   Prior to Admission medications   Medication Sig Start Date End Date Taking? Authorizing Provider  alendronate (FOSAMAX) 70 MG tablet Take 70 mg by mouth every Wednesday. Take with a full glass of water on an empty stomach.    Historical Provider, MD  carbidopa-levodopa (SINEMET IR) 25-100 MG per tablet Take 2  tablets by mouth 6 (six) times daily. 6a/10a/12p/2p/4p/6p    Historical Provider, MD  carbidopa-levodopa (SINEMET IR) 25-100 MG per tablet Take 1 tablet by mouth 2 (two) times daily. 8a/8p    Historical Provider, MD  carbidopa-levodopa (SINEMET IR) 25-100 MG per tablet Take 1.5 tablets by mouth at bedtime. 10p    Historical Provider, MD  Cholecalciferol (VITAMIN D) 2000 UNITS tablet Take 2,000 Units by mouth daily with breakfast.     Historical Provider, MD  citalopram (CELEXA) 20 MG tablet Take 20 mg by mouth every morning.    Historical Provider, MD  clonazePAM (KLONOPIN) 0.5 MG tablet Take 0.25 mg by mouth daily as needed for anxiety.     Historical Provider, MD  cyanocobalamin 500 MCG tablet Take 500 mcg by mouth daily with breakfast.     Historical Provider, MD  docusate sodium (COLACE) 100 MG capsule Take 200 mg by mouth at bedtime.    Historical Provider, MD  donepezil (ARICEPT) 10 MG tablet Take 10 mg by mouth daily with breakfast.     Historical Provider, MD  entacapone (COMTAN) 200 MG tablet Take 200 mg by mouth 5 (five) times daily. 6a/8a/10a/12p/8p    Historical Provider, MD  folic acid (FOLVITE) 1 MG tablet Take 1 mg by mouth daily with breakfast.     Historical Provider, MD  meloxicam (MOBIC) 7.5 MG tablet Take 7.5 mg by mouth 2 (two) times daily as needed for pain.     Historical Provider, MD  memantine (NAMENDA) 10 MG tablet Take 10  mg by mouth 2 (two) times daily.    Historical Provider, MD  methocarbamol (ROBAXIN) 500 MG tablet Take 500 mg by mouth at bedtime as needed for muscle spasms.    Historical Provider, MD  Multiple Vitamin (MULTIVITAMIN WITH MINERALS) TABS tablet Take 1 tablet by mouth daily with breakfast.     Historical Provider, MD  QUEtiapine (SEROQUEL) 100 MG tablet Take 50 mg by mouth 2 (two) times daily as needed (for agitation and restlessness).    Historical Provider, MD   Ht  (1.905 m)  Wt 231 lb (104.781 kg)  BMI 28.87 kg/m2 Physical Exam  HENT:  Head:  Normocephalic and atraumatic.  Dime-sized abrasion of forehead  Eyes: Conjunctivae are normal. Pupils are equal, round, and reactive to light.  Neck: Neck supple. No tracheal deviation present. No thyromegaly present.  Cardiovascular: Normal rate and regular rhythm.   No murmur heard. Pulmonary/Chest: Effort normal and breath sounds normal.  Abdominal: Soft. Bowel sounds are normal. He exhibits no distension. There is no tenderness.  Musculoskeletal: Normal range of motion. He exhibits no edema or tenderness.  Entire spine nontender. Pelvis stable. Nontender. All 4 extremities without contusion abrasion or tenderness neurovascular intact  Neurological: He is alert. Coordination normal.  Speech incomprehensible, moves all extremities, parkinsonian tremor. Does not follow some commands  Skin: Skin is warm and dry. No rash noted.  Psychiatric: He has a normal mood and affect.  Nursing note and vitals reviewed.   ED Course  Procedures (including critical care time) Labs Review Labs Reviewed - No data to display  Imaging Review No results found.   EKG Interpretation None      Date: 02/21/2015  Rate: 85  Rhythm: normal sinus rhythm  QRS Axis: normal  Intervals: normal  ST/T Wave abnormalities: normal  Conduction Disutrbances: none  Narrative Interpretation: unremarkable  12:10 PM patient appears comfortable. Results for orders placed or performed during the hospital encounter of 02/21/15  I-Stat Chem 8, ED  Result Value Ref Range   Sodium 138 135 - 145 mmol/L   Potassium 3.6 3.5 - 5.1 mmol/L   Chloride 100 (L) 101 - 111 mmol/L   BUN 25 (H) 6 - 20 mg/dL   Creatinine, Ser 1.61 0.61 - 1.24 mg/dL   Glucose, Bld 096 (H) 70 - 99 mg/dL   Calcium, Ion 0.45 (L) 1.13 - 1.30 mmol/L   TCO2 22 0 - 100 mmol/L   Hemoglobin 13.6 13.0 - 17.0 g/dL   HCT 40.9 81.1 - 91.4 %   Ct Head Wo Contrast  02/21/2015   CLINICAL DATA:  Unwitnessed fall.  Laceration above right eye.  EXAM: CT HEAD  WITHOUT CONTRAST  CT CERVICAL SPINE WITHOUT CONTRAST  TECHNIQUE: Multidetector CT imaging of the head and cervical spine was performed following the standard protocol without intravenous contrast. Multiplanar CT image reconstructions of the cervical spine were also generated.  COMPARISON:  04/22/2014  FINDINGS: CT HEAD FINDINGS  There is no intracranial hemorrhage, mass or evidence of acute infarction. There is moderate generalized atrophy. There is mild chronic microvascular ischemic change. There is no significant extra-axial fluid collection.  No acute intracranial findings are evident.  CT CERVICAL SPINE FINDINGS  There is anterior interbody fusion and fixation hardware from C5 through C7. The vertebral column is intact. Pedicles and facet articulations are intact. There is no evidence of acute cervical spine fracture. There are moderate changes of facet arthritis, greatest on the right at C3-4. Mild degenerative disc changes are present. There  is moderate left convex curvature centered at C5. There is no bone lesion or bony destruction.  IMPRESSION: *Moderate generalized cerebral atrophy and chronic microvascular ischemic change. No acute intracranial findings. *Negative for acute cervical spine fracture. There are intact appearances of the C5-C7 fusion. Moderate cervical facet arthritis and degenerative disc changes.   Electronically Signed   By: Ellery Plunkaniel R Mitchell M.D.   On: 02/21/2015 23:54   Ct Cervical Spine Wo Contrast  02/21/2015   CLINICAL DATA:  Unwitnessed fall.  Laceration above right eye.  EXAM: CT HEAD WITHOUT CONTRAST  CT CERVICAL SPINE WITHOUT CONTRAST  TECHNIQUE: Multidetector CT imaging of the head and cervical spine was performed following the standard protocol without intravenous contrast. Multiplanar CT image reconstructions of the cervical spine were also generated.  COMPARISON:  04/22/2014  FINDINGS: CT HEAD FINDINGS  There is no intracranial hemorrhage, mass or evidence of acute  infarction. There is moderate generalized atrophy. There is mild chronic microvascular ischemic change. There is no significant extra-axial fluid collection.  No acute intracranial findings are evident.  CT CERVICAL SPINE FINDINGS  There is anterior interbody fusion and fixation hardware from C5 through C7. The vertebral column is intact. Pedicles and facet articulations are intact. There is no evidence of acute cervical spine fracture. There are moderate changes of facet arthritis, greatest on the right at C3-4. Mild degenerative disc changes are present. There is moderate left convex curvature centered at C5. There is no bone lesion or bony destruction.  IMPRESSION: *Moderate generalized cerebral atrophy and chronic microvascular ischemic change. No acute intracranial findings. *Negative for acute cervical spine fracture. There are intact appearances of the C5-C7 fusion. Moderate cervical facet arthritis and degenerative disc changes.   Electronically Signed   By: Ellery Plunkaniel R Mitchell M.D.   On: 02/21/2015 23:54   Ct Pelvis Wo Contrast  02/21/2015   CLINICAL DATA:  Patient found on floor with small laceration above right eye. Right hip pain. History of Parkinson's disease.  EXAM: CT PELVIS WITHOUT CONTRAST  TECHNIQUE: Multidetector CT imaging of the pelvis was performed following the standard protocol without intravenous contrast.  COMPARISON:  CT 10/17/2013  FINDINGS: There is a right hip total arthroplasty. No evidence of fracture or dislocation of the prosthetic. The left hip is located without evidence of fracture. No pelvic fracture or sacral fracture. Limited view of the bowel demonstrates mild sigmoid diverticulosis without diverticulitis. Bladder is normal. Atherosclerotic calcification aorta  IMPRESSION: 1. No evidence of fracture dislocation in the pelvis. 2. Right hip total arthroplasty without fracture or dislocation.   Electronically Signed   By: Genevive BiStewart  Edmunds M.D.   On: 02/21/2015 23:56    MDM   Plan return to assisted-living facility Diagnosis #1 fall #2 minor closed head trauma #3 contusions to multiple sites Final diagnoses:  None        Doug SouSam Mauri Temkin, MD 02/22/15 [id number]

## 2015-02-21 NOTE — ED Notes (Signed)
Patient returned from radiology testing.

## 2015-02-21 NOTE — ED Notes (Signed)
  Pt transported to ct 

## 2015-02-21 NOTE — ED Notes (Signed)
Pt was sent from an assist living facility for a unwitnessed fall. Pt was found on floor was a small laceration above right eye. Bleeding is controlled. Pt c/o pain in right hip area. Pt is alert and ox2.

## 2015-02-21 NOTE — ED Notes (Signed)
Patient is from [facility].

## 2015-02-22 NOTE — ED Notes (Signed)
Dr. Felix PaciniJacobuwitz at the bedside to update patient. Planning for discharge.

## 2015-02-22 NOTE — Discharge Instructions (Signed)
CT scans of head cervical spine and pelvis were obtained. No fractures. No serious injuries detected

## 2015-02-22 NOTE — ED Notes (Signed)
Called and spoke to [place] and [facility] in regards to patient being transferred back. [facility].

## 2015-03-02 ENCOUNTER — Emergency Department (HOSPITAL_COMMUNITY)
Admission: EM | Admit: 2015-03-02 | Discharge: 2015-03-02 | Disposition: A | Discharge status: Home / Self Care | Payer: Medicare Other | Attending: Emergency Medicine | Admitting: Emergency Medicine

## 2015-03-02 ENCOUNTER — Emergency Department (HOSPITAL_COMMUNITY): Payer: Medicare Other

## 2015-03-02 ENCOUNTER — Encounter (HOSPITAL_COMMUNITY): Payer: Self-pay | Admitting: Emergency Medicine

## 2015-03-02 DIAGNOSIS — I10 Essential (primary) hypertension: Secondary | ICD-10-CM | POA: Diagnosis not present

## 2015-03-02 DIAGNOSIS — G309 Alzheimer's disease, unspecified: Secondary | ICD-10-CM | POA: Insufficient documentation

## 2015-03-02 DIAGNOSIS — E785 Hyperlipidemia, unspecified: Secondary | ICD-10-CM | POA: Insufficient documentation

## 2015-03-02 DIAGNOSIS — R079 Chest pain, unspecified: Secondary | ICD-10-CM | POA: Diagnosis not present

## 2015-03-02 DIAGNOSIS — F419 Anxiety disorder, unspecified: Secondary | ICD-10-CM | POA: Diagnosis not present

## 2015-03-02 DIAGNOSIS — G20 Parkinson's disease: Secondary | ICD-10-CM | POA: Insufficient documentation

## 2015-03-02 DIAGNOSIS — Z79899 Other long term (current) drug therapy: Secondary | ICD-10-CM | POA: Diagnosis not present

## 2015-03-02 DIAGNOSIS — F0280 Dementia in other diseases classified elsewhere without behavioral disturbance: Secondary | ICD-10-CM | POA: Insufficient documentation

## 2015-03-02 DIAGNOSIS — G629 Polyneuropathy, unspecified: Secondary | ICD-10-CM | POA: Diagnosis not present

## 2015-03-02 DIAGNOSIS — F329 Major depressive disorder, single episode, unspecified: Secondary | ICD-10-CM | POA: Diagnosis not present

## 2015-03-02 LAB — CBG MONITORING, ED: GLUCOSE-CAPILLARY: 90 mg/dL (ref 65–99)

## 2015-03-02 LAB — CBC
HCT: 41.2 % (ref 39.0–52.0)
Hemoglobin: 14 g/dL (ref 13.0–17.0)
MCH: 30.2 pg (ref 26.0–34.0)
MCHC: 34 g/dL (ref 30.0–36.0)
MCV: 89 fL (ref 78.0–100.0)
Platelets: 209 10*3/uL (ref 150–400)
RBC: 4.63 MIL/uL (ref 4.22–5.81)
RDW: 12.4 % (ref 11.5–15.5)
WBC: 7.2 10*3/uL (ref 4.0–10.5)

## 2015-03-02 LAB — I-STAT CHEM 8, ED
BUN: 17 mg/dL (ref 6–20)
CHLORIDE: 101 mmol/L (ref 101–111)
Calcium, Ion: 1.2 mmol/L (ref 1.13–1.30)
Creatinine, Ser: 0.8 mg/dL (ref 0.61–1.24)
Glucose, Bld: 92 mg/dL (ref 65–99)
HEMATOCRIT: 43 % (ref 39.0–52.0)
Hemoglobin: 14.6 g/dL (ref 13.0–17.0)
POTASSIUM: 3.7 mmol/L (ref 3.5–5.1)
SODIUM: 141 mmol/L (ref 135–145)
TCO2: 25 mmol/L (ref 0–100)

## 2015-03-02 LAB — I-STAT TROPONIN, ED: TROPONIN I, POC: 0 ng/mL (ref 0.00–0.08)

## 2015-03-02 MED ORDER — ASPIRIN 81 MG PO CHEW
324.0000 mg | CHEWABLE_TABLET | Freq: Once | ORAL | Status: AC
  Administered 2015-03-02: 324 mg via ORAL
  Filled 2015-03-02: qty 4

## 2015-03-02 NOTE — ED Notes (Signed)
Per pt, hx of parkinsons disease. C/o chronic CP, sharp, central location. Pt is AAOX4, in NAD.

## 2015-03-02 NOTE — Discharge Instructions (Signed)
Take a baby aspirin daily  Please call your doctor for a followup appointment within 24-48 hours. When you talk to your doctor please let them know that you were seen in the emergency department and have them acquire all of your records so that they can discuss the findings with you and formulate a treatment plan to fully care for your new and ongoing problems.

## 2015-03-02 NOTE — ED Notes (Signed)
Called [facility] for pt 

## 2015-03-02 NOTE — ED Notes (Signed)
Pulse ox sensor placed on pt's toe beneath sock to prevent pt from removing and chewing on pulse ox.

## 2015-03-02 NOTE — ED Provider Notes (Signed)
CSN: [id number]     Arrival date & time 03/02/15  1048 History   None    Chief Complaint  Patient presents with  . Chest Pain     (Consider location/radiation/quality/duration/timing/severity/associated sxs/prior Treatment) HPI Comments: The patient is a 59 year old male, he has a history of Parkinson's disease as well as dementia, he is currently in a memory care unit at a nursing facility. After breakfast this morning the patient complained of chest pain and was evaluated by staff. At this time the patient has some difficulty describing what the chest pain feels like, and is very easily distracted by the frustration of poor memory.  He has no known history of heart disease.  EMR review show no stress / echo.  The history is provided by the patient.    Past Medical History  Diagnosis Date  . Parkinson disease   . Depression   . Alzheimer's dementia   . Anxiety   . Hyperlipidemia   . Hypertension   . Neuromuscular disorder    Past Surgical History  Procedure Laterality Date  . Cervical fusion    . Hip arthroplasty Right 01/04/2013    Procedure: ARTHROPLASTY BIPOLAR HIP;  Surgeon: Verlee RossettiSteven R Norris, MD;  Location: [facility];  Service: Orthopedics;  Laterality: Right;   Family History  Problem Relation Age of Onset  . Lung cancer Paternal Grandmother    History  Substance Use Topics  . Smoking status: Never Smoker   . Smokeless tobacco: Never Used  . Alcohol Use: No    Review of Systems  Unable to perform ROS: Dementia      Allergies  Review of patient's allergies indicates no known allergies.  Home Medications   Prior to Admission medications   Medication Sig Start Date End Date Taking? Authorizing Provider  acetaminophen (TYLENOL) 325 MG tablet Take 650 mg by mouth See admin instructions. Take 2 tablet by mouth 30 min before meals and at bedtime   Yes Historical Provider, MD  alendronate (FOSAMAX) 70 MG tablet Take 70 mg by mouth every Wednesday. Take with a full  glass of water on an empty stomach. MAR states take on wednesdays however its marked patient taken it yesterday. Last taken on 02-25-15   Yes Historical Provider, MD  carbidopa-levodopa (SINEMET IR) 25-100 MG per tablet Take 2 tablets by mouth See admin instructions. Take 2 tabs (50-200) by mouth 30 minutes before meals and bedtime   Yes Historical Provider, MD  Cholecalciferol (VITAMIN D) 2000 UNITS tablet Take 2,000 Units by mouth daily with breakfast.    Yes Historical Provider, MD  citalopram (CELEXA) 20 MG tablet Take 20 mg by mouth every morning.   Yes Historical Provider, MD  clonazePAM (KLONOPIN) 0.5 MG tablet Take 0.25 mg by mouth daily as needed for anxiety.    Yes Historical Provider, MD  cyanocobalamin 500 MCG tablet Take 500 mcg by mouth daily with breakfast.    Yes Historical Provider, MD  docusate sodium (COLACE) 100 MG capsule Take 200 mg by mouth at bedtime.   Yes Historical Provider, MD  donepezil (ARICEPT) 10 MG tablet Take 10 mg by mouth daily with breakfast.    Yes Historical Provider, MD  entacapone (COMTAN) 200 MG tablet Take 200 mg by mouth See admin instructions. Take 1 tablet by mouth 30 min before meals and at bedtime   Yes Historical Provider, MD  folic acid (FOLVITE) 1 MG tablet Take 1 mg by mouth daily with breakfast.    Yes Historical Provider, MD  lisinopril (PRINIVIL,ZESTRIL) 5 MG tablet Take 5 mg by mouth daily.   Yes Historical Provider, MD  memantine (NAMENDA) 5 MG tablet Take 5 mg by mouth daily.   Yes Historical Provider, MD  methocarbamol (ROBAXIN) 500 MG tablet Take 500 mg by mouth See admin instructions. Take 1 tablet by mouth 30 min before meals and at bedtime   Yes Historical Provider, MD  QUEtiapine (SEROQUEL) 100 MG tablet Take 50 mg by mouth at bedtime.   Yes Historical Provider, MD  OLANZapine (ZYPREXA) 2.5 MG tablet Take 2.5 mg by mouth every 12 (twelve) hours as needed.    Historical Provider, MD  traZODone (DESYREL) 50 MG tablet Take 50 mg by mouth at  bedtime. For sleep    Historical Provider, MD   BP 150/91 mmHg  Pulse 79  Temp(Src) 97.7 F (36.5 C) (Oral)  Resp 16  SpO2 100% Physical Exam  Constitutional: He appears well-developed and well-nourished. No distress.  HENT:  Head: Normocephalic and atraumatic.  Mouth/Throat: Oropharynx is clear and moist. No oropharyngeal exudate.  Eyes: Conjunctivae and EOM are normal. Pupils are equal, round, and reactive to light. Right eye exhibits no discharge. Left eye exhibits no discharge. No scleral icterus.  Neck: Normal range of motion. Neck supple. No JVD present. No thyromegaly present.  Cardiovascular: Normal rate, regular rhythm, normal heart sounds and intact distal pulses.  Exam reveals no gallop and no friction rub.   No murmur heard. Pulmonary/Chest: Effort normal and breath sounds normal. No respiratory distress. He has no wheezes. He has no rales.  Abdominal: Soft. Bowel sounds are normal. He exhibits no distension and no mass. There is no tenderness.  Musculoskeletal: Normal range of motion. He exhibits no edema or tenderness.  Lymphadenopathy:    He has no cervical adenopathy.  Neurological: He is alert.  Persistent tremor, memory loss, follows commands without difficulty other than tremor  Skin: Skin is warm and dry. No rash noted. No erythema.  Psychiatric: He has a normal mood and affect. His behavior is normal.  Nursing note and vitals reviewed.   ED Course  Procedures (including critical care time) Labs Review Labs Reviewed  CBC  I-STAT TROPOININ, ED  I-STAT CHEM 8, ED  CBG MONITORING, ED    Imaging Review Dg Chest Port 1 View  03/02/2015   CLINICAL DATA:  Chest pain for several days  EXAM: PORTABLE CHEST - 1 VIEW  COMPARISON:  August 21, 2014  FINDINGS: There is slight upper lobe atelectatic change. There is no edema or consolidation. Heart size and pulmonary vascularity are normal. No adenopathy.  IMPRESSION: Mild left upper lobe atelectasis.  No edema or  consolidation.   Electronically Signed   By: Bretta BangWilliam  Woodruff III M.D.   On: 03/02/2015 11:33     EKG Interpretation   Date/Time:  Friday Mar 02 2015 11:01:25 EDT Ventricular Rate:  84 PR Interval:  207 QRS Duration: 83 QT Interval:  375 QTC Calculation: 443 R Axis:   69 Text Interpretation:  Sinus rhythm Borderline prolonged PR interval  Anteroseptal infarct, age indeterminate Baseline wander in lead(s) V3 V6  since last tracing no significant change Confirmed by Gwendolyn Nishi  MD, Laaibah Wartman  [phone] on 03/02/2015 11:48:39 AM      MDM   Final diagnoses:  Chest pain, unspecified chest pain type     The pt will need cardiac w/u - he has htn, but no other rf for CAD - he is not tachycardic, he has no edema or asymetry to  the legs - ECG, labs.  The patient was evaluated with EKG, chest x-ray and lab work, the blood tests show no signs of electrolyte or renal dysfunction, normal troponin, normal blood counts and a normal x-ray with some atelectasis but no consolidations. He has no coughing fevers chills or tachycardia and his EKG is unremarkable.  The patient appears stable for discharge, he is low risk for acute coronary syndrome especially with a normal EKG, this was communicated with the nurses at his facility, he will be discharged back to that facility at this time.  Eber Hong, MD 03/02/15 1242

## 2015-04-07 ENCOUNTER — Emergency Department (HOSPITAL_COMMUNITY)
Admission: EM | Admit: 2015-04-07 | Discharge: 2015-04-08 | Disposition: A | Discharge status: Home / Self Care | Payer: Medicare Other | Attending: Emergency Medicine | Admitting: Emergency Medicine

## 2015-04-07 ENCOUNTER — Emergency Department (HOSPITAL_COMMUNITY): Payer: Medicare Other

## 2015-04-07 DIAGNOSIS — Z79899 Other long term (current) drug therapy: Secondary | ICD-10-CM | POA: Insufficient documentation

## 2015-04-07 DIAGNOSIS — Y9289 Other specified places as the place of occurrence of the external cause: Secondary | ICD-10-CM | POA: Diagnosis not present

## 2015-04-07 DIAGNOSIS — G3183 Dementia with Lewy bodies: Secondary | ICD-10-CM | POA: Insufficient documentation

## 2015-04-07 DIAGNOSIS — F0280 Dementia in other diseases classified elsewhere without behavioral disturbance: Secondary | ICD-10-CM | POA: Insufficient documentation

## 2015-04-07 DIAGNOSIS — I10 Essential (primary) hypertension: Secondary | ICD-10-CM | POA: Insufficient documentation

## 2015-04-07 DIAGNOSIS — F419 Anxiety disorder, unspecified: Secondary | ICD-10-CM | POA: Insufficient documentation

## 2015-04-07 DIAGNOSIS — G309 Alzheimer's disease, unspecified: Secondary | ICD-10-CM | POA: Insufficient documentation

## 2015-04-07 DIAGNOSIS — S73004A Unspecified dislocation of right hip, initial encounter: Secondary | ICD-10-CM | POA: Insufficient documentation

## 2015-04-07 DIAGNOSIS — W19XXXA Unspecified fall, initial encounter: Secondary | ICD-10-CM

## 2015-04-07 DIAGNOSIS — M25551 Pain in right hip: Secondary | ICD-10-CM

## 2015-04-07 DIAGNOSIS — Z7982 Long term (current) use of aspirin: Secondary | ICD-10-CM | POA: Diagnosis not present

## 2015-04-07 DIAGNOSIS — Y998 Other external cause status: Secondary | ICD-10-CM | POA: Insufficient documentation

## 2015-04-07 DIAGNOSIS — W1839XA Other fall on same level, initial encounter: Secondary | ICD-10-CM | POA: Diagnosis not present

## 2015-04-07 DIAGNOSIS — S79911A Unspecified injury of right hip, initial encounter: Secondary | ICD-10-CM | POA: Diagnosis present

## 2015-04-07 DIAGNOSIS — S0181XA Laceration without foreign body of other part of head, initial encounter: Secondary | ICD-10-CM

## 2015-04-07 DIAGNOSIS — Y9389 Activity, other specified: Secondary | ICD-10-CM | POA: Diagnosis not present

## 2015-04-07 NOTE — ED Provider Notes (Signed)
CSN: [id number]     Arrival date & time 04/07/15  2329 History   First MD Initiated Contact with Patient 04/07/15 2337     Chief Complaint  Patient presents with  . Fall    Patient was trying to sit in a chair and missed the chair. He fell on the hardwood floor and right hip is displaced and a laceration on forehead. This was a witnessed fall and patient did not loose consciousness.     (Consider location/radiation/quality/duration/timing/severity/associated sxs/prior Treatment) Patient is a 59 y.o. male presenting with fall.  Fall This is a recurrent problem. Episode onset: just PTA. The problem occurs constantly. The problem has not changed since onset.Associated symptoms comments: Head pain, R hip pain. Exacerbated by: movement. Nothing relieves the symptoms.    Past Medical History  Diagnosis Date  . Parkinson disease   . Depression   . Alzheimer's dementia   . Anxiety   . Hyperlipidemia   . Hypertension   . Neuromuscular disorder    Past Surgical History  Procedure Laterality Date  . Cervical fusion    . Hip arthroplasty Right 01/04/2013    Procedure: ARTHROPLASTY BIPOLAR HIP;  Surgeon: Verlee Rossetti, MD;  Location: [facility];  Service: Orthopedics;  Laterality: Right;   Family History  Problem Relation Age of Onset  . Lung cancer Paternal Grandmother    History  Substance Use Topics  . Smoking status: Never Smoker   . Smokeless tobacco: Never Used  . Alcohol Use: No    Review of Systems  Unable to perform ROS: Dementia      Allergies  Review of patient's allergies indicates no known allergies.  Home Medications   Prior to Admission medications   Medication Sig Start Date End Date Taking? Authorizing Provider  acetaminophen (TYLENOL) 325 MG tablet Take 650 mg by mouth See admin instructions. Take 2 tablet by mouth 30 min before meals and at bedtime   Yes Historical Provider, MD  aspirin 325 MG tablet Take 325 mg by mouth daily.   Yes Historical Provider, MD   carbidopa-levodopa (SINEMET IR) 25-100 MG per tablet Take 2 tablets by mouth See admin instructions. Take 2 tabs (50-200) by mouth 30 minutes before meals and bedtime   Yes Historical Provider, MD  Cholecalciferol (VITAMIN D) 2000 UNITS tablet Take 2,000 Units by mouth daily with breakfast.    Yes Historical Provider, MD  citalopram (CELEXA) 20 MG tablet Take 20 mg by mouth every morning.   Yes Historical Provider, MD  cyanocobalamin 500 MCG tablet Take 1,000 mcg by mouth daily with breakfast.    Yes Historical Provider, MD  docusate sodium (COLACE) 100 MG capsule Take 200 mg by mouth at bedtime.   Yes Historical Provider, MD  donepezil (ARICEPT) 10 MG tablet Take 10 mg by mouth daily with breakfast.    Yes Historical Provider, MD  entacapone (COMTAN) 200 MG tablet Take 200 mg by mouth See admin instructions. Take 1 tablet by mouth 30 min before meals and at bedtime   Yes Historical Provider, MD  folic acid (FOLVITE) 1 MG tablet Take 1 mg by mouth daily with breakfast.    Yes Historical Provider, MD  lisinopril (PRINIVIL,ZESTRIL) 5 MG tablet Take 2.5 mg by mouth daily.    Yes Historical Provider, MD  Melatonin 1 MG TABS Take 1 tablet by mouth at bedtime.   Yes Historical Provider, MD  memantine (NAMENDA) 5 MG tablet Take 5 mg by mouth daily.   Yes Historical Provider, MD  methocarbamol (ROBAXIN) 500 MG tablet Take 500 mg by mouth See admin instructions. Take 1 tablet by mouth 30 min before meals and at bedtime   Yes Historical Provider, MD  Multiple Vitamin (MULTIVITAMIN WITH MINERALS) TABS tablet Take 1 tablet by mouth daily.   Yes Historical Provider, MD  OVER THE COUNTER MEDICATION Apply 1 application topically daily. Baza Cream applied to buttocks   Yes Historical Provider, MD  PRESCRIPTION MEDICATION Apply 1 mL topically daily as needed (agitation).   Yes Historical Provider, MD  QUEtiapine (SEROQUEL) 25 MG tablet Take 25 mg by mouth every 12 (twelve) hours as needed (aggressive behavior).    Yes Historical Provider, MD  QUEtiapine (SEROQUEL) 25 MG tablet Take 75 mg by mouth at bedtime.   Yes Historical Provider, MD  alendronate (FOSAMAX) 70 MG tablet Take 70 mg by mouth every Monday. Take with a full glass of water on an empty stomach. MAR states take on wednesdays however its marked patient taken it yesterday. Last taken on 02-25-15    Historical Provider, MD  clonazePAM (KLONOPIN) 0.5 MG tablet Take 0.25 mg by mouth daily as needed for anxiety.     Historical Provider, MD  PRESCRIPTION MEDICATION Apply 1 mL topically once. Haldol paste 2mg /41ml    Historical Provider, MD  QUEtiapine (SEROQUEL) 100 MG tablet Take 50 mg by mouth at bedtime.    Historical Provider, MD   BP 113/73 mmHg  Pulse 77  Temp(Src) 98.1 F (36.7 C) (Oral)  Resp 12  SpO2 99% Physical Exam  Constitutional: He appears well-developed and well-nourished.  HENT:  Head: Normocephalic.  4 cm laceration to L brow  Eyes: Conjunctivae and EOM are normal.  Neck: Normal range of motion. Neck supple.  Cardiovascular: Normal rate, regular rhythm and normal heart sounds.   Pulmonary/Chest: Effort normal and breath sounds normal. No respiratory distress.  Abdominal: He exhibits no distension. There is no tenderness. There is no rebound and no guarding.  Musculoskeletal: Normal range of motion.       Right hip: He exhibits tenderness.  Neurological: He is alert.  Skin: Skin is warm and dry.  Vitals reviewed.   ED Course  Reduction of dislocation Date/Time: 04/08/2015 8:06 AM Performed by: Mirian Mo Authorized by: Mirian Mo Consent: The procedure was performed in an emergent situation. Verbal consent obtained. Time out: Immediately prior to procedure a "time out" was called to verify the correct patient, procedure, equipment, support staff and site/side marked as required. Preparation: Patient was prepped and draped in the usual sterile fashion. Local anesthesia used: no Patient sedated: yes Sedation  type: moderate (conscious) sedation Sedatives: propofol Sedation start date/time: 04/08/2015 6:00 AM Sedation end date/time: 04/08/2015 6:34 AM Vitals: Vital signs were monitored during sedation. Patient tolerance: Patient tolerated the procedure well with no immediate complications  LACERATION REPAIR Date/Time: 04/08/2015 8:08 AM Performed by: Mirian Mo Authorized by: Mirian Mo Consent: The procedure was performed in an emergent situation. Time out: Immediately prior to procedure a "time out" was called to verify the correct patient, procedure, equipment, support staff and site/side marked as required. Body area: head/neck Location details: left eyebrow Laceration length: 4 cm Tendon involvement: none Nerve involvement: none Vascular damage: no Anesthesia: local infiltration Local anesthetic: lidocaine 2% with epinephrine Patient sedated: yes Sedation type: moderate (conscious) sedation Sedatives: propofol Vitals: Vital signs were monitored during sedation. Preparation: Patient was prepped and draped in the usual sterile fashion. Irrigation solution: saline Irrigation method: syringe Amount of cleaning: standard Debridement: none Degree of  undermining: none Wound skin closure material used: 4-0 vicryl rapide. Number of sutures: 5 Technique: simple Approximation: close Approximation difficulty: simple Patient tolerance: Patient tolerated the procedure well with no immediate complications   (including critical care time) Labs Review Labs Reviewed  CBC WITH DIFFERENTIAL/PLATELET - Abnormal; Notable for the following:    RBC 4.18 (*)    HCT 38.0 (*)    Platelets 125 (*)    All other components within normal limits  BASIC METABOLIC PANEL - Abnormal; Notable for the following:    Potassium 2.9 (*)    Calcium 8.8 (*)    All other components within normal limits    Imaging Review Dg Chest 1 View  04/08/2015   CLINICAL DATA:  Fall. History of Parkinson disease,  Alzheimer dementia, hypertension, neuro muscular disorder, right hip arthroplasty, nonsmoker.  EXAM: CHEST  1 VIEW  COMPARISON:  03/02/2015  FINDINGS: The heart size and mediastinal contours are within normal limits. Both lungs are clear. The visualized skeletal structures are unremarkable.  IMPRESSION: No active disease.   Electronically Signed   By: Burman Nieves M.D.   On: 04/08/2015 00:40   Dg Thoracic Spine 2 View  04/08/2015   CLINICAL DATA:  Patient was trying to sit in a chair and missed the chair last night. He fell on the hardwood floor and right hip is displaced and a laceration on forehead  EXAM: THORACIC SPINE - 2 VIEW  COMPARISON:  Radiograph 03/1915, 03/02/2015, chest radiographs  FINDINGS: There is bulky osteophytes of the thoracic spine. No evidence of acute loss of vertebral body height. There is a scoliosis on the frontal projection. Normal paraspinal lines. Anterior cervical fusion noted.  IMPRESSION: 1. No radiographic evidence of thoracic spine injury. 2. Bulky osteophytosis and scoliosis.   Electronically Signed   By: Genevive Bi M.D.   On: 04/08/2015 07:26   Dg Lumbar Spine Complete  04/08/2015   CLINICAL DATA:  Patient was trying to sit in a chair and missed the chair last night. He fell on the hardwood floor and right hip is displaced and a laceration on forehead  EXAM: LUMBAR SPINE - COMPLETE 4+ VIEW  COMPARISON:  None  FINDINGS: Bulky osteophytosis of lumbar spine. Scoliosis noted. No acute loss vertebral body height disc height. No subluxation.  IMPRESSION: No evidence acute trauma to the lumbar spine.  Bulky osteophytosis.   Electronically Signed   By: Genevive Bi M.D.   On: 04/08/2015 07:28   Ct Head Wo Contrast  04/08/2015   CLINICAL DATA:  Fall on hardwood floor. No loss of consciousness. Patient is a phasic. Left frontal hematoma and laceration.  EXAM: CT HEAD WITHOUT CONTRAST  CT CERVICAL SPINE WITHOUT CONTRAST  TECHNIQUE: Multidetector CT imaging of the head  and cervical spine was performed following the standard protocol without intravenous contrast. Multiplanar CT image reconstructions of the cervical spine were also generated.  COMPARISON:  02/21/2015  FINDINGS: CT HEAD FINDINGS  Diffuse cerebral atrophy. No ventricular dilatation. Mild patchy low-attenuation change in the deep white matter consistent with small vessel ischemia. Small left anterior frontal subcutaneous scalp hematoma. No mass effect or midline shift. No abnormal extra-axial fluid collections. Gray-white matter junctions are distinct. Basal cisterns are not effaced. No evidence of acute intracranial hemorrhage. No depressed skull fractures. Visualized paranasal sinuses and mastoid air cells are not opacified.  CT CERVICAL SPINE FINDINGS  Postoperative changes with anterior plate and screw fixation and intervertebral disc fusion from C5 through C7. Hardware components appear intact.  There is mild reversal of the usual cervical lordosis at this level, likely postoperative. Degenerative changes at C4-5 and at C7-T1 levels. Slight anterior subluxation of C2 on C3 and C4 on C5. These changes are similar to previous study and likely due to postoperative and/or degenerative changes. No vertebral compression deformities. No prevertebral soft tissue swelling. Diffuse degenerative changes throughout the facet joints. C1-2 articulation appears intact. No focal bone lesion or bone destruction. Bone cortex and trabecular architecture appear intact.  IMPRESSION: No acute intracranial abnormalities. Chronic atrophy and small vessel ischemic changes. Small subcutaneous scalp hematoma over the left anterior frontal region.  Postoperative changes with anterior fixation from C5 through C7. Alignment changes are stable since previous study and likely postoperative. No acute displaced fractures are demonstrated. Degenerative changes in the cervical spine.   Electronically Signed   By: Burman Nieves M.D.   On:  04/08/2015 00:33   Ct Cervical Spine Wo Contrast  04/08/2015   CLINICAL DATA:  Fall on hardwood floor. No loss of consciousness. Patient is a phasic. Left frontal hematoma and laceration.  EXAM: CT HEAD WITHOUT CONTRAST  CT CERVICAL SPINE WITHOUT CONTRAST  TECHNIQUE: Multidetector CT imaging of the head and cervical spine was performed following the standard protocol without intravenous contrast. Multiplanar CT image reconstructions of the cervical spine were also generated.  COMPARISON:  02/21/2015  FINDINGS: CT HEAD FINDINGS  Diffuse cerebral atrophy. No ventricular dilatation. Mild patchy low-attenuation change in the deep white matter consistent with small vessel ischemia. Small left anterior frontal subcutaneous scalp hematoma. No mass effect or midline shift. No abnormal extra-axial fluid collections. Gray-white matter junctions are distinct. Basal cisterns are not effaced. No evidence of acute intracranial hemorrhage. No depressed skull fractures. Visualized paranasal sinuses and mastoid air cells are not opacified.  CT CERVICAL SPINE FINDINGS  Postoperative changes with anterior plate and screw fixation and intervertebral disc fusion from C5 through C7. Hardware components appear intact. There is mild reversal of the usual cervical lordosis at this level, likely postoperative. Degenerative changes at C4-5 and at C7-T1 levels. Slight anterior subluxation of C2 on C3 and C4 on C5. These changes are similar to previous study and likely due to postoperative and/or degenerative changes. No vertebral compression deformities. No prevertebral soft tissue swelling. Diffuse degenerative changes throughout the facet joints. C1-2 articulation appears intact. No focal bone lesion or bone destruction. Bone cortex and trabecular architecture appear intact.  IMPRESSION: No acute intracranial abnormalities. Chronic atrophy and small vessel ischemic changes. Small subcutaneous scalp hematoma over the left anterior frontal  region.  Postoperative changes with anterior fixation from C5 through C7. Alignment changes are stable since previous study and likely postoperative. No acute displaced fractures are demonstrated. Degenerative changes in the cervical spine.   Electronically Signed   By: Burman Nieves M.D.   On: 04/08/2015 00:33   Ct Hip Right Wo Contrast  04/08/2015   CLINICAL DATA:  Postreduction right hip dislocation. Fell last night.  EXAM: CT OF THE RIGHT HIP WITHOUT CONTRAST  TECHNIQUE: Multidetector CT imaging of the right hip was performed according to the standard protocol. Multiplanar CT image reconstructions were also generated.  COMPARISON:  Right hip 04/08/2015.  CT pelvis 02/21/2015.  FINDINGS: Right hip hemiarthroplasty with non cemented femoral component. Since the previous plain radiograph, there is been interval relocation of the prosthesis within the acetabulum. No evidence of residual dislocation. Streak artifact from hardware limits the examination. There are several bone fragments demonstrated posterior to the femoral neck. Fragments have  become displaced since the previous CT study there is no specific defect demonstrated on the acetabulum and fragments may have come from osteophytes or dystrophic calcification off of the greater femoral trochanter. Pubic rami appear intact. No significant soft tissue hematoma.  IMPRESSION: Right hip hemiarthroplasty has been relocated within the acetabulum. No evidence of persistent dislocation. Displaced bone fragments posterior to the femoral neck have changed in position since previous study and may represent fractures arising from hypertrophic osteophytes off of the femoral trochanter. The acetabulum appears intact and no discrete acetabular defects are identified.   Electronically Signed   By: Burman Nieves M.D.   On: 04/08/2015 06:57   Dg Hip Unilat With Pelvis 2-3 Views Right  04/08/2015   CLINICAL DATA:  Fall.  Right hip arthroplasty.  Right hip pain.   EXAM: RIGHT HIP (WITH PELVIS) 2-3 VIEWS  COMPARISON:  CT pelvis 02/21/2015  FINDINGS: Previous right hip hemiarthroplasty with non cemented femoral component. There is superior and posterior dislocation of the prosthesis with respect to the acetabulum. Fracture fragments demonstrated over the presumed posterior acetabulum. SI joints and symphysis pubis are not displaced. Degenerative changes in the lower lumbar spine and left hip.  IMPRESSION: Dislocated right hip arthroplasty.  Associated acetabular fractures.   Electronically Signed   By: Burman Nieves M.D.   On: 04/08/2015 00:44     EKG Interpretation None      MDM   Final diagnoses:  Right hip pain  Dislocation of right hip, initial encounter  Facial laceration, initial encounter    59 y.o. male with pertinent PMH of Parkinson's, dementia, anxeity presents after fall from chair with hit to head.  Fall mechanical, pt at his baseline.  Exam as above with R hip dislocation.  This was reduced using propofol as above.  Laceration repaired of forehead.  Spoke with orthopedics prior to reduction and after.  They recommended fu in 4 weeks with biotech brace.  Plan to dc home after placement.   I have reviewed all laboratory and imaging studies if ordered as above  1. Right hip pain   2. Fall   3. Dislocation of right hip, initial encounter   4. Facial laceration, initial encounter         Mirian Mo, MD 04/08/15 2255

## 2015-04-07 NOTE — ED Notes (Signed)
Bed: KD32 Expected date:  Expected time:  Means of arrival:  Comments: EMS 70M fall from facility

## 2015-04-08 ENCOUNTER — Emergency Department (HOSPITAL_COMMUNITY): Payer: Medicare Other

## 2015-04-08 DIAGNOSIS — S73004A Unspecified dislocation of right hip, initial encounter: Secondary | ICD-10-CM | POA: Diagnosis not present

## 2015-04-08 LAB — CBC WITH DIFFERENTIAL/PLATELET
BASOS PCT: 1 % (ref 0–1)
Basophils Absolute: 0 10*3/uL (ref 0.0–0.1)
Eosinophils Absolute: 0.3 10*3/uL (ref 0.0–0.7)
Eosinophils Relative: 5 % (ref 0–5)
HCT: 38 % — ABNORMAL LOW (ref 39.0–52.0)
HEMOGLOBIN: 13.2 g/dL (ref 13.0–17.0)
Lymphocytes Relative: 25 % (ref 12–46)
Lymphs Abs: 1.7 10*3/uL (ref 0.7–4.0)
MCH: 31.6 pg (ref 26.0–34.0)
MCHC: 34.7 g/dL (ref 30.0–36.0)
MCV: 90.9 fL (ref 78.0–100.0)
MONO ABS: 0.7 10*3/uL (ref 0.1–1.0)
Monocytes Relative: 10 % (ref 3–12)
Neutro Abs: 3.9 10*3/uL (ref 1.7–7.7)
Neutrophils Relative %: 59 % (ref 43–77)
Platelets: 125 10*3/uL — ABNORMAL LOW (ref 150–400)
RBC: 4.18 MIL/uL — AB (ref 4.22–5.81)
RDW: 12.4 % (ref 11.5–15.5)
WBC: 6.6 10*3/uL (ref 4.0–10.5)

## 2015-04-08 LAB — BASIC METABOLIC PANEL
Anion gap: 9 (ref 5–15)
BUN: 15 mg/dL (ref 6–20)
CO2: 29 mmol/L (ref 22–32)
Calcium: 8.8 mg/dL — ABNORMAL LOW (ref 8.9–10.3)
Chloride: 101 mmol/L (ref 101–111)
Creatinine, Ser: 0.68 mg/dL (ref 0.61–1.24)
GFR calc non Af Amer: >60 mL/min (ref >60)
Glucose, Bld: 86 mg/dL (ref 65–99)
Potassium: 2.9 mmol/L — ABNORMAL LOW (ref 3.5–5.1)
Sodium: 139 mmol/L (ref 135–145)

## 2015-04-08 MED ORDER — LIDOCAINE-EPINEPHRINE (PF) 2 %-1:200000 IJ SOLN
20.0000 mL | Freq: Once | INTRAMUSCULAR | Status: AC
  Administered 2015-04-08: 20 mL via INTRADERMAL
  Filled 2015-04-08: qty 20

## 2015-04-08 MED ORDER — PROPOFOL 10 MG/ML IV BOLUS
1.0000 mg/kg | Freq: Once | INTRAVENOUS | Status: AC
  Administered 2015-04-08: 100 mg via INTRAVENOUS
  Filled 2015-04-08: qty 1

## 2015-04-08 MED ORDER — SODIUM CHLORIDE 0.9 % IV BOLUS (SEPSIS)
1000.0000 mL | Freq: Once | INTRAVENOUS | Status: AC
  Administered 2015-04-08: 1000 mL via INTRAVENOUS

## 2015-04-08 MED ORDER — HYDROMORPHONE HCL 1 MG/ML IJ SOLN
1.0000 mg | Freq: Once | INTRAMUSCULAR | Status: AC
  Administered 2015-04-08: 1 mg via INTRAVENOUS
  Filled 2015-04-08: qty 1

## 2015-04-08 NOTE — Discharge Instructions (Signed)
°Emergency Department Resource Guide °1) Find a Doctor and Pay Out of Pocket °Although you won't have to find out who is covered by your insurance plan, it is a good idea to ask around and get recommendations. You will then need to call the office and see if the doctor you have chosen will accept you as a new patient and what types of options they offer for patients who are self-pay. Some doctors offer discounts or will set up payment plans for their patients who do not have insurance, but you will need to ask so you aren't surprised when you get to your appointment. ° °2) Contact Your Local Health Department °Not all health departments have doctors that can see patients for sick visits, but many do, so it is worth a call to see if yours does. If you don't know where your local health department is, you can check in your phone book. The CDC also has a tool to help you locate your state's health department, and many state websites also have listings of all of their local health departments. ° °3) Find a Walk-in Clinic °If your illness is not likely to be very severe or complicated, you may want to try a walk in clinic. These are popping up all over the country in pharmacies, drugstores, and shopping centers. They're usually staffed by nurse practitioners or physician assistants that have been trained to treat common illnesses and complaints. They're usually fairly quick and inexpensive. However, if you have serious medical issues or chronic medical problems, these are probably not your best option. ° °No Primary Care Doctor: °- Call [facility] at  832-8000 - they can help you locate a primary care doctor that  accepts your insurance, provides certain services, etc. °- Physician Referral Service- 1-800-533-3463 ° °Chronic Pain Problems: °Organization         Address  Phone   Notes  °[facility]  (336) 297-2271 Patients need to be referred by their primary care doctor.  ° °Medication  Assistance: °Organization         Address  Phone   Notes  °[place] [facility] [address] °[place], [place] [address] (336) 641-8030 --Must be a resident of [place] °-- Must have NO insurance coverage whatsoever (no Medicaid/ Medicare, etc.) °-- The pt. MUST have a primary care doctor that directs their care regularly and follows them in the community °  °[facility]  (866) 331-1348   °[employer]  (888) 892-1162   ° °Agencies that provide inexpensive medical care: °Organization         Address  Phone   Notes  °[facility]  (336) 832-8035   °[facility]    (336) 832-7272   °[facility] [address] °[place], [place] [address] (336) 832-4777   °[facility] [address], °[place] (336) 271-4999   °[facility]    (336) 373-0678   °[facility]    (336) 272-1050   °[facility] ° [address], [place] Phone:  (336) 832-4444, Fax:  (336) 832-4440 Hours of Operation:  9 am - 6 pm, M-F.  Also accepts Medicaid/Medicare and self-pay.  °[facility] ° [address], [place] Phone: (336) 832-3150, Fax: (336) 832-3151. Hours of Operation:  8:30 am - 5:30 pm, M-F.  Also accepts Medicaid and self-pay.  °[facility] [place] [address], [place] Phone: (336) 878-6027   °[facility] [address], [place], [place] (336)723-1848, Ext. 123 Mondays & Thursdays: 7-9 AM.  First 15 patients are seen on a first come, first serve basis. °  ° °Medicaid-accepting [place] Providers: ° °Organization         Address  Phone   Notes  °[facility] [address], [place] (336) 641-2100 Also accepts self-pay patients.  °[facility] [address], [place] ° (336) 856-9996   °[facility] [address], [place]  (336) 288-8857   °[facility] [address] [place] [address], [place] (336) 299-7000   °Veita Bland [address], [place]  ° (336) 373-1557 Only accepts [place] Access Medicaid patients after they have their name applied to their card.  ° °Self-Pay (no insurance) in [place]: ° °Organization         Address  Phone   Notes  °Sickle Cell Patients, [facility] [address], [place] (336) 832-1970   °[facility] [address], [place] (336) 832-4400   °[facility] ° [address], [place] (336) 992-4800   °[facility]/Dr. Osei-Bonsu ° [address] [place] [address], [place] or [address], [place] (336) 841-8500 Phone number for both [place] and [place] locations is the same.  °[facility] [address], [place] (336) 299-0000   °[facility] [address] [place] [address], [place] or [address] (336) 852-7530 °(336) 878-2260   °[facility] [address], [place] (336) 350-1642, phone; (336) 294-5005, fax Sees patients 1st and 3rd Saturday of every month.  Must not qualify for public or private insurance (i.e. Medicaid, Medicare, [place] Health Choice, Veterans' Benefits) • Household income should be no more than 200% of the poverty level •The clinic cannot treat you if you are pregnant or think you are pregnant • Sexually transmitted diseases are not treated at the clinic.  ° ° °Dental Care: °Organization         Address  Phone  Notes  °[place] [facility] [address], [place] (336) 641-6152 Accepts children up to age 21 who are enrolled in Medicaid or [place] Health Choice; pregnant women with a Medicaid card; and children who have applied for Medicaid or [place] Health Choice, but were declined, whose parents can pay a reduced fee at time of service.  °[place]  [facility] [place]  [address], [place] (336) 641-7733 Accepts children up to age 21 who are enrolled in Medicaid or [place] Health Choice; pregnant women with a Medicaid card; and children who have applied for Medicaid or [place] Health Choice, but were declined, whose parents can pay a reduced fee at time of service.  °[facility] ° [address], [place] (336) 641-4533 Patients are seen by appointment only. Walk-ins are not accepted. [facility] will see patients 18 years of age and older. °Monday - Tuesday (8am-5pm) °Most Wednesdays (8:30-5pm) °$30 per visit, cash only  °[facility] ° [address], [place] (336) 641-4533 Patients are seen by appointment only. Walk-ins are not accepted. [facility] will see patients 18 years of age and older. °One   Wednesday Evening (Monthly: Volunteer Based).  $30 per visit, cash only  °[facility]  (919) 537-3737 for adults; Children under age 4, call Graduate Pediatric Dentistry at ([phone]) [phone]. Children aged 4-14, please call (919) [phone] to request a pediatric application. ° Dental services are provided in all areas of dental care including fillings, crowns and bridges, complete and partial dentures, implants, gum treatment, root canals, and extractions. Preventive care is also provided. Treatment is provided to both adults and children. °Patients are selected via a lottery and there is often a waiting list. °  °[facility] [address], °[place] ° (336) 763-8833 [email or web address] °  °[facility] [address], [place], [place] (336)723-1848, Ext. 123 Second and Fourth Thursday of each month, opens at 6:30 AM; Clinic ends at 9 AM.  Patients are seen on a first-come first-served basis, and a limited number are seen during each clinic.  ° °[facility] ° [address], [place], [place] (336) 723-7904    Eligibility Requirements °You must have lived in [place], [place], or [place] counties for at least the last three months. °  You cannot be eligible for state or federal sponsored healthcare insurance, including Veterans Administration, Medicaid, or Medicare. °  You generally cannot be eligible for healthcare insurance through your employer.  °  How to apply: °Eligibility screenings are held every Tuesday and Wednesday afternoon from 1:00 pm until 4:00 pm. You do not need an appointment for the interview!  °[facility] [address], [place], [place] 336-631-2330   °[facility]  336-342-8273   °[facility]  336-703-3100   °[facility]  336-570-6415   ° °Behavioral Health Resources in the Community: °Intensive Outpatient Programs °Organization         Address  Phone  Notes  °[facility] [address], [place], [place] 336-878-6098   °[facility] [address], [place], [place] 336-832-9800   °[facility] [address], [place], [place] ° 336-882-2125   °[facility] [address],  °[place], [place] 1-800-853-5163 or 336-641-4981   °Substance Abuse Resources °Organization         Address  Phone  Notes  °[facility]  336-882-2125   °[facility]  336-784-9470   °[facility]  336-285-9073   °[facility]  336-845-3988   °Residential & Outpatient Substance Abuse Program  1-800-659-3381   °Psychological Services °Organization         Address  Phone  Notes  °[facility]  336- 832-9600   °[facility]  336- 378-7881   °[facility] [address], [place] 1-800-853-5163 or 336-641-4981   ° °Mobile Crisis Teams °Organization         Address  Phone  Notes  °[facility], Mobile Crisis Care Unit  1-877-626-1772   °Assertive °[facility] ° [address]  [place], [place] 336-834-9664   °Sharon DeEsch [address] °[place] 336-554-5454   ° °Self-Help/Support Groups °Organization         Address  Phone             Notes  °[facility]  - variety of support groups  336- 373-1402 Call for more information  °Narcotics Anonymous (NA), [facility] [address], °[place]  2 meetings at this location  ° °  Residential Treatment Programs Organization         Address  Phone  Notes  [facility] [address]    [place]  7-253-664-4034   [facility]  [address] [place] [address], [place], [place] 638-756-4332   [facility] [address] [place], [place] 951-884-1660 Admissions: 8am-3pm M-F  [facility] [address],    [place], [place] 630-160-1093   [facility] [address] [place], [place], [place] 235-573-2202   [facility] [address],  [place], [place] 542-706-2376   [facility] Intensive Outpatient [address], [place], [place] 283-151-7616   [facility] ([facility] [address] [place]  [place], [place] 0-737-106-2694 or 520-621-0612   [facility] [address] [place], [place] 093-818-2993 Accepts Medicaid  [facility] [address]  [place] 7-169-678-9381 Substance Abuse/Addiction Treatment   [facility]         Address  Phone  Notes  [facility]  671-656-7306   Angie Fava, PhD [address] [place], [place]   (581)654-2946 or 512-582-5016   [facility]   [address] [place], [place] [phone]   [facility] [address], [place], [place] 431-376-4403 Insurance/Medicaid/sponsorship through [facility] [address]                                    [place], [place] 878-365-0825 Therapy/tele-psych/case    [facility] [address], [place] 214-809-0246    Dr. Lolly Mustache  (586)769-8036   [facility]  [employer] [facility] Dept. 1) [address] [place] 2) [address] [place] 3)  [address], [place] [phone] (615)[phone]  808-476-8900   [facility] Child Abuse Hotline 780-172-7418 or 716-710-9257 (After Hours)      Wear the hip brace until you are seen in follow up. Your stitches will dissolve.  Call your regular Orthopedic doctor on Monday to schedule a follow up appointment within the next 2 days.  Return to the Emergency Department immediately sooner if worsening.

## 2015-04-08 NOTE — ED Notes (Signed)
Pt had episode of urinary incontinence. Pt cleaned, dried, and repositioned. Pt moaning in pain.

## 2015-04-08 NOTE — ED Notes (Signed)
Hip brace applied by ortho tech

## 2015-04-08 NOTE — ED Notes (Signed)
Pt in CT.

## 2015-04-08 NOTE — ED Notes (Signed)
Below orders not completed by EW. 

## 2015-04-17 ENCOUNTER — Emergency Department (HOSPITAL_COMMUNITY)
Admission: EM | Admit: 2015-04-17 | Discharge: 2015-04-17 | Disposition: A | Discharge status: Home / Self Care | Payer: Medicare Other | Attending: Emergency Medicine | Admitting: Emergency Medicine

## 2015-04-17 ENCOUNTER — Emergency Department (HOSPITAL_COMMUNITY): Payer: Medicare Other

## 2015-04-17 ENCOUNTER — Emergency Department (HOSPITAL_COMMUNITY)
Admission: EM | Admit: 2015-04-17 | Discharge: 2015-04-20 | Disposition: A | Discharge status: Skilled Nursing Facility | Payer: Medicare Other | Attending: Emergency Medicine | Admitting: Emergency Medicine

## 2015-04-17 ENCOUNTER — Encounter (HOSPITAL_COMMUNITY): Payer: Self-pay | Admitting: Emergency Medicine

## 2015-04-17 DIAGNOSIS — I10 Essential (primary) hypertension: Secondary | ICD-10-CM | POA: Diagnosis not present

## 2015-04-17 DIAGNOSIS — Y939 Activity, unspecified: Secondary | ICD-10-CM | POA: Diagnosis not present

## 2015-04-17 DIAGNOSIS — R45851 Suicidal ideations: Secondary | ICD-10-CM | POA: Diagnosis present

## 2015-04-17 DIAGNOSIS — F419 Anxiety disorder, unspecified: Secondary | ICD-10-CM | POA: Diagnosis not present

## 2015-04-17 DIAGNOSIS — Z9889 Other specified postprocedural states: Secondary | ICD-10-CM

## 2015-04-17 DIAGNOSIS — Z8639 Personal history of other endocrine, nutritional and metabolic disease: Secondary | ICD-10-CM | POA: Insufficient documentation

## 2015-04-17 DIAGNOSIS — G309 Alzheimer's disease, unspecified: Secondary | ICD-10-CM | POA: Diagnosis not present

## 2015-04-17 DIAGNOSIS — Z79899 Other long term (current) drug therapy: Secondary | ICD-10-CM | POA: Insufficient documentation

## 2015-04-17 DIAGNOSIS — W19XXXA Unspecified fall, initial encounter: Secondary | ICD-10-CM

## 2015-04-17 DIAGNOSIS — G20 Parkinson's disease: Secondary | ICD-10-CM | POA: Insufficient documentation

## 2015-04-17 DIAGNOSIS — Z7982 Long term (current) use of aspirin: Secondary | ICD-10-CM | POA: Insufficient documentation

## 2015-04-17 DIAGNOSIS — W1839XA Other fall on same level, initial encounter: Secondary | ICD-10-CM | POA: Insufficient documentation

## 2015-04-17 DIAGNOSIS — Y929 Unspecified place or not applicable: Secondary | ICD-10-CM | POA: Diagnosis not present

## 2015-04-17 DIAGNOSIS — Z96641 Presence of right artificial hip joint: Secondary | ICD-10-CM | POA: Diagnosis not present

## 2015-04-17 DIAGNOSIS — S79911A Unspecified injury of right hip, initial encounter: Secondary | ICD-10-CM | POA: Diagnosis present

## 2015-04-17 DIAGNOSIS — N390 Urinary tract infection, site not specified: Secondary | ICD-10-CM | POA: Insufficient documentation

## 2015-04-17 DIAGNOSIS — F0281 Dementia in other diseases classified elsewhere with behavioral disturbance: Secondary | ICD-10-CM | POA: Insufficient documentation

## 2015-04-17 DIAGNOSIS — F329 Major depressive disorder, single episode, unspecified: Secondary | ICD-10-CM | POA: Insufficient documentation

## 2015-04-17 DIAGNOSIS — F0391 Unspecified dementia with behavioral disturbance: Secondary | ICD-10-CM

## 2015-04-17 DIAGNOSIS — Y999 Unspecified external cause status: Secondary | ICD-10-CM | POA: Insufficient documentation

## 2015-04-17 DIAGNOSIS — F03918 Unspecified dementia, unspecified severity, with other behavioral disturbance: Secondary | ICD-10-CM

## 2015-04-17 DIAGNOSIS — S73004A Unspecified dislocation of right hip, initial encounter: Secondary | ICD-10-CM | POA: Diagnosis not present

## 2015-04-17 DIAGNOSIS — F0280 Dementia in other diseases classified elsewhere without behavioral disturbance: Secondary | ICD-10-CM | POA: Diagnosis not present

## 2015-04-17 LAB — CBC WITH DIFFERENTIAL/PLATELET
BASOS ABS: 0 10*3/uL (ref 0.0–0.1)
BASOS PCT: 0 % (ref 0–1)
Eosinophils Absolute: 0.1 10*3/uL (ref 0.0–0.7)
Eosinophils Relative: 1 % (ref 0–5)
HEMATOCRIT: 35.8 % — AB (ref 39.0–52.0)
Hemoglobin: 12.3 g/dL — ABNORMAL LOW (ref 13.0–17.0)
LYMPHS PCT: 10 % — AB (ref 12–46)
Lymphs Abs: 0.8 10*3/uL (ref 0.7–4.0)
MCH: 30.8 pg (ref 26.0–34.0)
MCHC: 34.4 g/dL (ref 30.0–36.0)
MCV: 89.7 fL (ref 78.0–100.0)
Monocytes Absolute: 0.6 10*3/uL (ref 0.1–1.0)
Monocytes Relative: 7 % (ref 3–12)
NEUTROS ABS: 7 10*3/uL (ref 1.7–7.7)
Neutrophils Relative %: 82 % — ABNORMAL HIGH (ref 43–77)
Platelets: 232 10*3/uL (ref 150–400)
RBC: 3.99 MIL/uL — AB (ref 4.22–5.81)
RDW: 11.9 % (ref 11.5–15.5)
WBC: 8.4 10*3/uL (ref 4.0–10.5)

## 2015-04-17 LAB — URINE MICROSCOPIC-ADD ON

## 2015-04-17 LAB — URINALYSIS, ROUTINE W REFLEX MICROSCOPIC
GLUCOSE, UA: NEGATIVE mg/dL
Ketones, ur: 40 mg/dL — AB
Nitrite: POSITIVE — AB
PROTEIN: NEGATIVE mg/dL
SPECIFIC GRAVITY, URINE: 1.029 (ref 1.005–1.030)
Urobilinogen, UA: 1 mg/dL (ref 0.0–1.0)
pH: 6 (ref 5.0–8.0)

## 2015-04-17 LAB — BASIC METABOLIC PANEL
Anion gap: 8 (ref 5–15)
BUN: 20 mg/dL (ref 6–20)
CO2: 29 mmol/L (ref 22–32)
CREATININE: 0.7 mg/dL (ref 0.61–1.24)
Calcium: 8.8 mg/dL — ABNORMAL LOW (ref 8.9–10.3)
Chloride: 103 mmol/L (ref 101–111)
GFR calc Af Amer: >60 mL/min (ref >60)
GFR calc non Af Amer: >60 mL/min (ref >60)
Glucose, Bld: 108 mg/dL — ABNORMAL HIGH (ref 65–99)
Potassium: 3.2 mmol/L — ABNORMAL LOW (ref 3.5–5.1)
Sodium: 140 mmol/L (ref 135–145)

## 2015-04-17 LAB — ETHANOL: Alcohol, Ethyl (B): <5 mg/dL (ref <5)

## 2015-04-17 LAB — ACETAMINOPHEN LEVEL: Acetaminophen (Tylenol), Serum: <10 ug/mL — ABNORMAL LOW (ref 10–30)

## 2015-04-17 LAB — SALICYLATE LEVEL: Salicylate Lvl: <4 mg/dL (ref 2.8–30.0)

## 2015-04-17 MED ORDER — PROPOFOL 10 MG/ML IV BOLUS
INTRAVENOUS | Status: AC | PRN
Start: 2015-04-17 — End: 2015-04-17
  Administered 2015-04-17: 104 mg via INTRAVENOUS

## 2015-04-17 MED ORDER — CYANOCOBALAMIN 500 MCG PO TABS
500.0000 ug | ORAL_TABLET | Freq: Every day | ORAL | Status: DC
  Administered 2015-04-18 – 2015-04-20 (×3): 500 ug via ORAL
  Filled 2015-04-17 (×4): qty 1

## 2015-04-17 MED ORDER — QUETIAPINE FUMARATE 50 MG PO TABS
75.0000 mg | ORAL_TABLET | Freq: Every day | ORAL | Status: DC
  Administered 2015-04-17: 75 mg via ORAL

## 2015-04-17 MED ORDER — PROPOFOL 10 MG/ML IV BOLUS
1.0000 mg/kg | Freq: Once | INTRAVENOUS | Status: AC
  Administered 2015-04-17: 104 mg via INTRAVENOUS
  Filled 2015-04-17: qty 1

## 2015-04-17 MED ORDER — QUETIAPINE FUMARATE 25 MG PO TABS
12.5000 mg | ORAL_TABLET | Freq: Two times a day (BID) | ORAL | Status: DC | PRN
  Filled 2015-04-17: qty 1

## 2015-04-17 MED ORDER — LISINOPRIL 2.5 MG PO TABS
2.5000 mg | ORAL_TABLET | Freq: Every day | ORAL | Status: DC
  Administered 2015-04-17 – 2015-04-20 (×4): 2.5 mg via ORAL
  Filled 2015-04-17 (×4): qty 1

## 2015-04-17 MED ORDER — ENTACAPONE 200 MG PO TABS
200.0000 mg | ORAL_TABLET | Freq: Four times a day (QID) | ORAL | Status: DC
  Administered 2015-04-17 – 2015-04-20 (×11): 200 mg via ORAL
  Filled 2015-04-17 (×14): qty 1

## 2015-04-17 MED ORDER — CLONAZEPAM 0.5 MG PO TABS
0.2500 mg | ORAL_TABLET | Freq: Three times a day (TID) | ORAL | Status: DC | PRN
  Administered 2015-04-18: 0.25 mg via ORAL
  Filled 2015-04-17: qty 1

## 2015-04-17 MED ORDER — FOLIC ACID 1 MG PO TABS
1.0000 mg | ORAL_TABLET | Freq: Every day | ORAL | Status: DC
  Administered 2015-04-18 – 2015-04-20 (×3): 1 mg via ORAL
  Filled 2015-04-17 (×3): qty 1

## 2015-04-17 MED ORDER — DOCUSATE SODIUM 100 MG PO CAPS
200.0000 mg | ORAL_CAPSULE | Freq: Every day | ORAL | Status: DC
  Administered 2015-04-17 – 2015-04-19 (×3): 200 mg via ORAL
  Filled 2015-04-17 (×3): qty 2

## 2015-04-17 MED ORDER — DONEPEZIL HCL 10 MG PO TABS
10.0000 mg | ORAL_TABLET | Freq: Every day | ORAL | Status: DC
  Administered 2015-04-18 – 2015-04-20 (×3): 10 mg via ORAL
  Filled 2015-04-17 (×4): qty 1

## 2015-04-17 MED ORDER — CITALOPRAM HYDROBROMIDE 20 MG PO TABS: 20.0000 mg | ORAL_TABLET | ORAL | Status: DC

## 2015-04-17 MED ORDER — HYDROMORPHONE HCL 1 MG/ML IJ SOLN
1.0000 mg | Freq: Once | INTRAMUSCULAR | Status: AC
  Administered 2015-04-17: 1 mg via INTRAVENOUS
  Filled 2015-04-17: qty 1

## 2015-04-17 MED ORDER — ALENDRONATE SODIUM 70 MG PO TABS: 70.0000 mg | ORAL_TABLET | ORAL | Status: DC

## 2015-04-17 MED ORDER — VITAMIN D3 25 MCG (1000 UNIT) PO TABS
1000.0000 [IU] | ORAL_TABLET | Freq: Every day | ORAL | Status: DC
  Administered 2015-04-18 – 2015-04-20 (×3): 1000 [IU] via ORAL
  Filled 2015-04-17 (×3): qty 1

## 2015-04-17 MED ORDER — CARBIDOPA-LEVODOPA 25-100 MG PO TABS
2.0000 | ORAL_TABLET | Freq: Four times a day (QID) | ORAL | Status: DC
  Administered 2015-04-17 – 2015-04-20 (×11): 2 via ORAL
  Filled 2015-04-17 (×14): qty 2

## 2015-04-17 MED ORDER — ALUM & MAG HYDROXIDE-SIMETH 200-200-20 MG/5ML PO SUSP: 30.0000 mL | ORAL | Status: DC | PRN

## 2015-04-17 MED ORDER — ASPIRIN 325 MG PO TABS
325.0000 mg | ORAL_TABLET | Freq: Every day | ORAL | Status: DC
  Administered 2015-04-17 – 2015-04-20 (×4): 325 mg via ORAL
  Filled 2015-04-17 (×4): qty 1

## 2015-04-17 MED ORDER — ACETAMINOPHEN 325 MG PO TABS: 650.0000 mg | ORAL_TABLET | ORAL | Status: DC | PRN

## 2015-04-17 NOTE — Progress Notes (Signed)
PHARMACIST - PHYSICIAN COMMUNICATION  CONCERNING: P&T Medication Policy Regarding Oral Bisphosphonates  RECOMMENDATION: Your order for alendronate (Fosamax), ibandronate (Boniva), or risedronate (Actonel) has been discontinued at this time.  If the patient's post-hospital medical condition warrants safe use of this class of drugs, please resume the pre-hospital regimen upon discharge.  DESCRIPTION:  Alendronate (Fosamax), ibandronate (Boniva), and risedronate (Actonel) can cause severe esophageal erosions in patients who are unable to remain upright at least 30 minutes after taking this medication.   Since brief interruptions in therapy are thought to have minimal impact on bone mineral density, the Pharmacy & Therapeutics Committee has established that bisphosphonate orders should be routinely discontinued during hospitalization.   To override this safety policy and permit administration of Boniva, Fosamax, or Actonel in the hospital, prescribers must write "DO NOT HOLD" in the comments section when placing the order for this class of medications.   Clance BollAmanda Amelia Burgard, PharmD, BCPS Pager: [phone] 04/17/2015 9:48 PM

## 2015-04-17 NOTE — ED Notes (Signed)
Bed: WA30 Expected date:  Expected time:  Means of arrival:  Comments: 

## 2015-04-17 NOTE — ED Provider Notes (Signed)
CSN: [id number]     Arrival date & time 04/17/15  1148 History   First MD Initiated Contact with Patient 04/17/15 1219     Chief Complaint  Patient presents with  . Fall   History is a patent obtained from EMS - patient has dementia  (Consider location/radiation/quality/duration/timing/severity/associated sxs/prior Treatment) Patient is a 59 y.o. male presenting with fall.  Fall   Patient is a 59 year old male, history of dementia, Parkinson's, right hip replacement, and recent falls and hip dislocation, who is brought to [facility] ED today for fall with repeated hip dislocation.  He did not hit his head today, no loss of consciousness.  Sutures are still present in his forehead from his fall and dislocation 2 weeks ago, on April 07, 2015.  Patient received 250 fentanyl by ems.    Past Medical History  Diagnosis Date  . Parkinson disease   . Depression   . Alzheimer's dementia   . Anxiety   . Hyperlipidemia   . Hypertension   . Neuromuscular disorder    Past Surgical History  Procedure Laterality Date  . Cervical fusion    . Hip arthroplasty Right 01/04/2013    Procedure: ARTHROPLASTY BIPOLAR HIP;  Surgeon: Verlee Rossetti, MD;  Location: [facility] ORS;  Service: Orthopedics;  Laterality: Right;   Family History  Problem Relation Age of Onset  . Lung cancer Paternal Grandmother    History  Substance Use Topics  . Smoking status: Never Smoker   . Smokeless tobacco: Never Used  . Alcohol Use: No    Review of Systems  Unable to perform ROS: Dementia   Allergies  Review of patient's allergies indicates no known allergies.  Home Medications   Prior to Admission medications   Medication Sig Start Date End Date Taking? Authorizing Provider  acetaminophen (TYLENOL) 325 MG tablet Take 650 mg by mouth See admin instructions. Take 2 tablet by mouth 30 min before meals and at bedtime   Yes Historical Provider, MD  acetaminophen-codeine (CAPITAL/CODEINE) 120-12 MG/5ML suspension Take  7.5 mLs by mouth every 6 (six) hours as needed for pain.   Yes Historical Provider, MD  alendronate (FOSAMAX) 70 MG tablet Take 70 mg by mouth every Monday. Take with a full glass of water on an empty stomach. MAR states take on wednesdays however its marked patient taken it yesterday. Last taken on 02-25-15   Yes Historical Provider, MD  aspirin 325 MG tablet Take 325 mg by mouth daily.   Yes Historical Provider, MD  carbidopa-levodopa (SINEMET IR) 25-100 MG per tablet Take 2 tablets by mouth See admin instructions. Take 2 tabs (50-200) by mouth 30 minutes before meals and bedtime. 7am, 11am,4pm,8pm   Yes Historical Provider, MD  cholecalciferol (VITAMIN D) 1000 UNITS tablet Take 1,000 Units by mouth. 11/27/14 11/27/15 Yes Historical Provider, MD  citalopram (CELEXA) 20 MG tablet Take 20 mg by mouth every morning.   Yes Historical Provider, MD  clonazePAM (KLONOPIN) 0.5 MG tablet Take 0.25 mg by mouth daily as needed for anxiety. Take Half a tablet (0.25mg ) by mouth daily as needed for anxiety   Yes Historical Provider, MD  cyanocobalamin 500 MCG tablet Take 1,000 mcg by mouth daily with breakfast. (Vitamin B12)   Yes Historical Provider, MD  docusate sodium (COLACE) 100 MG capsule Take 200 mg by mouth at bedtime.   Yes Historical Provider, MD  donepezil (ARICEPT) 10 MG tablet Take 10 mg by mouth daily with breakfast.    Yes Historical Provider, MD  entacapone (COMTAN) 200 MG tablet Take 200 mg by mouth See admin instructions. Take 1 tablet by mouth 30 min before meals and at bedtime. 7am,11am,4pm,8pm   Yes Historical Provider, MD  folic acid (FOLVITE) 1 MG tablet Take 1 mg by mouth daily with breakfast.    Yes Historical Provider, MD  Melatonin 1 MG TABS Take 1 tablet by mouth at bedtime.   Yes Historical Provider, MD  memantine (NAMENDA) 5 MG tablet Take 5 mg by mouth daily.   Yes Historical Provider, MD  methocarbamol (ROBAXIN) 500 MG tablet Take 500 mg by mouth See admin instructions. Take 1 tablet by  mouth 30 min before meals and at bedtime   Yes Historical Provider, MD  Multiple Vitamin (MULTIVITAMIN WITH MINERALS) TABS tablet Take 1 tablet by mouth daily.   Yes Historical Provider, MD  OVER THE COUNTER MEDICATION Apply 1 application topically daily. Baza Cream applied to buttocks   Yes Historical Provider, MD  PRESCRIPTION MEDICATION Apply 1 mL topically daily as needed (agitation). (Ativan gel)   Yes Historical Provider, MD  QUEtiapine (SEROQUEL) 25 MG tablet Take 12.5 mg by mouth every 12 (twelve) hours as needed (aggressive behavior). Take half a tablet (12.5mg ) by mouth every 12 hours as needed for aggressive behavior.   Yes Historical Provider, MD  QUEtiapine (SEROQUEL) 25 MG tablet Take 75 mg by mouth at bedtime.   Yes Historical Provider, MD  vitamin B-12 (CYANOCOBALAMIN) 1000 MCG tablet Take 1,000 mcg by mouth. 11/27/14 11/27/15 Yes Historical Provider, MD  lisinopril (PRINIVIL,ZESTRIL) 5 MG tablet Take 2.5 mg by mouth daily. Take Half a tablet (2.5mg ) daily    Historical Provider, MD  PRESCRIPTION MEDICATION Apply 1 mL topically once. Haldol paste 2mg /611ml    Historical Provider, MD   BP 95/60 mmHg  Pulse 69  Temp(Src) 97.6 F (36.4 C) (Axillary)  Resp 10  SpO2 98% Physical Exam  Constitutional: He appears well-developed and well-nourished. He is easily aroused. No distress.  Elderly man, appears stated age, laying in gurney, appears uncomfortable  HENT:  Head: Normocephalic and atraumatic. Head is without raccoon's eyes and without Battle's sign.  Right Ear: External ear normal.  Left Ear: External ear normal.  Nose: Nose normal.  Mouth/Throat: Mucous membranes are not pale and not dry.  Healing wound on forehead, clean, dry, intact  Eyes: Conjunctivae and EOM are normal. Pupils are equal, round, and reactive to light. No scleral icterus.  Neck: Normal range of motion. No JVD present. No tracheal deviation present.  Cardiovascular: Normal rate and regular rhythm.    Pulmonary/Chest: Effort normal and breath sounds normal. No respiratory distress.  Abdominal: Soft.  Musculoskeletal:       Right hip: He exhibits decreased range of motion, decreased strength, tenderness, bony tenderness and deformity.  Right leg internally rotated and shortened, with PT pulse 2+, in brace  Neurological: He is easily aroused. He displays tremor.  Resting tremor or hands  Skin: Skin is warm and dry. No rash noted. He is not diaphoretic. No cyanosis or erythema. No pallor. Nails show no clubbing.  Psychiatric: He is not withdrawn. Cognition and memory are not impaired. He is noncommunicative.  Vitals reviewed.   ED Course  Procedures (including critical care time) Labs Review Labs Reviewed  CBC WITH DIFFERENTIAL/PLATELET - Abnormal; Notable for the following:    RBC 3.99 (*)    Hemoglobin 12.3 (*)    HCT 35.8 (*)    Neutrophils Relative % 82 (*)    Lymphocytes Relative 10 (*)  All other components within normal limits  BASIC METABOLIC PANEL - Abnormal; Notable for the following:    Potassium 3.2 (*)    Glucose, Bld 108 (*)    Calcium 8.8 (*)    All other components within normal limits  URINALYSIS, ROUTINE W REFLEX MICROSCOPIC (NOT AT [facility])    Imaging Review Ct Head Wo Contrast  04/17/2015   CLINICAL DATA:  59 year old male status post fall.  EXAM: CT HEAD WITHOUT CONTRAST  TECHNIQUE: Contiguous axial images were obtained from the base of the skull through the vertex without intravenous contrast.  COMPARISON:  Head CT dated 04/08/2015  FINDINGS: There is slight prominence of the ventricles and sulci compatible with age-related volume loss. Mild periventricular and deep white matter hypodensities represent chronic microvascular ischemic changes. There is no intracranial hemorrhage. No mass effect or midline shift identified.  The visualized paranasal sinuses and mastoid air cells are well aerated. The calvarium is intact.  IMPRESSION: No acute intracranial  pathology.  Mild age-related atrophy and chronic microvascular ischemic disease.   Electronically Signed   By: Elgie Collard M.D.   On: 04/17/2015 15:39   Dg Hip Port Unilat With Pelvis 1v Right  04/17/2015   CLINICAL DATA:  Status post close reduction for dislocated total hip prosthesis  EXAM: RIGHT HIP (WITH PELVIS) 2 VIEW PORTABLE  COMPARISON:  Study obtained earlier in the day; April 08, 2015  FINDINGS: Frontal pelvis as well as frontal and lateral right hip images were obtained. The right hip dislocation has been reduced successful with a prosthesis now residing within the right hip joint region. There is a stable focus of calcification inferior to the prosthesis just lateral to the right ischium which may represent a chronic avulsion type injury. No acute fracture apparent. Currently no dislocation. Left hip joint appears unremarkable.  IMPRESSION: Successful reduction of hip dislocation on the right. Prosthesis appears well-seated and aligned within the right hip joint. Question chronic avulsion inferior to the prosthesis, stable. No acute fracture or dislocation apparent currently.   Electronically Signed   By: Bretta Bang III M.D.   On: 04/17/2015 16:30   Dg Hip Unilat With Pelvis 2-3 Views Right  04/17/2015   CLINICAL DATA:  Fall today with right hip dislocation  EXAM: RIGHT HIP (WITH PELVIS) 2-3 VIEWS  COMPARISON:  04/08/2015  FINDINGS: Dislocation of the right femoral prosthesis from the acetabulum is again noted. Bony fragments are occur I again identified stable from the prior exam likely related to prior fractures. No other bony abnormality is noted.  IMPRESSION: Changes consistent with dislocation of the femoral prosthesis from the acetabulum superiorly.   Electronically Signed   By: Alcide Clever M.D.   On: 04/17/2015 13:18     EKG Interpretation None      MDM   Final diagnoses:  Status post closed reduction of dislocated total hip prothesis   Pt has right hip dislocation -  prosthetic hip, without head trauma/LOC - per EMS, pt history/ROS limited by pt's dementia and parkinson's  Needs conscious sedation and reduction, done by Dr. Littie Deeds - please see his documentation for procedure  Hip films show successful reduction.  Head CT negative.  Pt at his baseline mental status. Vitals stable, pt will be discharged home.        Danelle Berry, PA-C 04/18/15 1610  Mirian Mo, MD 04/19/15 [phone]

## 2015-04-17 NOTE — ED Notes (Signed)
Per EMS-fell today, did not hit head, no LOC-right hip dislocation, dislocated same 2 weeks ago-given Fentanyl 250 mcg given in route-patient is at baseline mentally

## 2015-04-17 NOTE — ED Notes (Signed)
Bed: WA16 Expected date:  Expected time:  Means of arrival:  Comments: EMS fall 

## 2015-04-17 NOTE — Discharge Instructions (Signed)
Dislocation, General °A dislocation is a condition in which joint surfaces which are normally next to each other are no longer against each other. The bones are out of place.  °SYMPTOMS  °This is usually associated with pain, swelling and an inability to move the joint. There is also a deformity of the joint.  °DIAGNOSIS  °This diagnosis is easily made on examination. X-rays are often taken to make sure a broken bone (fracture) is not present. °TREATMENT  °Joint dislocations need treatment. Treatment involves putting the bones back in place (reduction). If left untreated, the dislocation can result in deformities with an unstable joint. Some joint dislocations which result in extensive damage to ligaments, cartilage, and other tissue may require surgery. °HOME CARE INSTRUCTIONS  °· Put ice on the injured area. °¨ Put ice in a plastic bag. °¨ Place a towel between your skin and the bag. °¨ Leave the ice on for 15-20 minutes, 03-04 times per day. Do this while awake, for the first 2 days. °· Keep the injured part raised (elevated) if possible, to lessen swelling. °· Continue activities as directed. °· If a lower extremity was dislocated, use crutches, a cane, or a walker as directed. °· Only take over-the-counter or prescription medicines for pain, discomfort, or fever as directed by your caregiver. °SEEK IMMEDIATE MEDICAL CARE IF:  °· There is an increase in bruising, swelling, or pain in the area of the dislocated joint. °· You notice coldness or numbness of the parts beyond the dislocation. °· There is no pain relief from medicines. °· There is severe pain. °· It appears or feels like the bones are out of place again. °MAKE SURE YOU:  °· Understand these instructions. °· Will watch your condition. °· Will get help right away if you are not doing well or get worse. °Document Released: 07/24/2006 Document Revised: 12/29/2011 Document Reviewed: 09/20/2007 °Exit[employer]® Patient Information ©2015 [employer]. This  information is not intended to replace advice given to you by your health care provider. Make sure you discuss any questions you have with your health care provider. ° °

## 2015-04-17 NOTE — ED Notes (Signed)
Patient transported to X-ray 

## 2015-04-17 NOTE — ED Notes (Signed)
Per NT Shakirah, unsuccessful in and out cath. Urinal is with patient is case patient goes.

## 2015-04-17 NOTE — Progress Notes (Addendum)
Pt is a resident of [facility] MD following pt there is Nadyne CoombesKaren Richter [phone] 1722 ED CM [facility]. Discussed wanting to get pt to another level of care after a family meeting at facility Cm discussed with EDP, Pfeiffer  1735 Orders entered

## 2015-04-17 NOTE — ED Notes (Signed)
Pt began to try to pocket medication. Only swallowed  Colace.

## 2015-04-17 NOTE — ED Notes (Addendum)
Pt is from [facility] was seen today for a Fall but was taken back to facility and states that he wants to kill himself by any means possible, per [facility]. Doctor told [facility] to bring patient back here and is taking out IVC paperwork. Hx of alzheimer's dementia and parkinson's disease.

## 2015-04-18 DIAGNOSIS — F39 Unspecified mood [affective] disorder: Secondary | ICD-10-CM

## 2015-04-18 DIAGNOSIS — N390 Urinary tract infection, site not specified: Secondary | ICD-10-CM | POA: Diagnosis not present

## 2015-04-18 MED ORDER — CLONAZEPAM 0.5 MG PO TABS
0.2500 mg | ORAL_TABLET | Freq: Two times a day (BID) | ORAL | Status: DC
  Administered 2015-04-18 – 2015-04-20 (×4): 0.25 mg via ORAL
  Filled 2015-04-18 (×4): qty 1

## 2015-04-18 MED ORDER — ENSURE ENLIVE PO LIQD
237.0000 mL | Freq: Two times a day (BID) | ORAL | Status: DC
  Administered 2015-04-19 – 2015-04-20 (×3): 237 mL via ORAL
  Filled 2015-04-18 (×4): qty 237

## 2015-04-18 MED ORDER — QUETIAPINE FUMARATE 100 MG PO TABS
100.0000 mg | ORAL_TABLET | Freq: Every day | ORAL | Status: DC
  Administered 2015-04-18 – 2015-04-19 (×2): 100 mg via ORAL
  Filled 2015-04-18 (×2): qty 1

## 2015-04-18 MED ORDER — CARBAMAZEPINE 200 MG PO TABS
200.0000 mg | ORAL_TABLET | Freq: Two times a day (BID) | ORAL | Status: DC
  Administered 2015-04-18 – 2015-04-20 (×5): 200 mg via ORAL
  Filled 2015-04-18 (×6): qty 1

## 2015-04-18 MED ORDER — TRAZODONE HCL 50 MG PO TABS
50.0000 mg | ORAL_TABLET | Freq: Every day | ORAL | Status: DC
  Administered 2015-04-18 – 2015-04-19 (×2): 50 mg via ORAL
  Filled 2015-04-18 (×2): qty 1

## 2015-04-18 MED ORDER — SULFAMETHOXAZOLE-TRIMETHOPRIM 800-160 MG PO TABS
1.0000 | ORAL_TABLET | Freq: Two times a day (BID) | ORAL | Status: DC
  Administered 2015-04-18 – 2015-04-20 (×5): 1 via ORAL
  Filled 2015-04-18 (×5): qty 1

## 2015-04-18 NOTE — Consult Note (Signed)
  Attempted an evaluation of patient but could not proceed as he refused to participate.  Patient was awake but did not answer questions.  His Nurse stated that patient did not speak much but answered few questions regarding his return to the ER.  His disposition is inpatient Psychiatric hospitalization and his information is faxed out [facility].Dahlia Byes.  Josephine Onuoha   PMHNP-BC Patient seen face-to-face for psychiatric evaluation, chart reviewed and case discussed with the physician extender and developed treatment plan. Reviewed the information documented and agree with the treatment plan. Thedore MinsMojeed Liann Spaeth, MD

## 2015-04-18 NOTE — Progress Notes (Signed)
Pt recommended for geri psych.   No availability  [facility]    Olga CoasterKristen Chrystie Hagwood, [place]  Clinical Social Work  [facility] [phone]

## 2015-04-18 NOTE — Progress Notes (Signed)
ED Cm consulted by ED SW after pt referral to geripsych.  Noted abnormal UA values resulted.  CM spoke with EDP, Docherty about this. Orders to be written, medicines to be provided and pt referral re presented to geripsych. ED SW updated
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
## 2015-04-18 NOTE — ED Provider Notes (Signed)
CSN: [id number]     Arrival date & time 04/17/15  1148 History   First MD Initiated Contact with Patient 04/17/15 1219     Chief Complaint  Patient presents with  . Fall     (Consider location/radiation/quality/duration/timing/severity/associated sxs/prior Treatment) HPI  Past Medical History  Diagnosis Date  . Parkinson disease   . Depression   . Alzheimer's dementia   . Anxiety   . Hyperlipidemia   . Hypertension   . Neuromuscular disorder    Past Surgical History  Procedure Laterality Date  . Cervical fusion    . Hip arthroplasty Right 01/04/2013    Procedure: ARTHROPLASTY BIPOLAR HIP;  Surgeon: Verlee RossettiSteven R Norris, MD;  Location: [facility];  Service: Orthopedics;  Laterality: Right;   Family History  Problem Relation Age of Onset  . Lung cancer Paternal Grandmother    History  Substance Use Topics  . Smoking status: Never Smoker   . Smokeless tobacco: Never Used  . Alcohol Use: No    Review of Systems    Allergies  Review of patient's allergies indicates no known allergies.  Home Medications   Prior to Admission medications   Medication Sig Start Date End Date Taking? Authorizing Provider  acetaminophen (TYLENOL) 325 MG tablet Take 650 mg by mouth 4 (four) times daily -  before meals and at bedtime. Take 2 tablet by mouth 30 min before meals and at bedtime   Yes Historical Provider, MD  acetaminophen-codeine (CAPITAL/CODEINE) 120-12 MG/5ML suspension Take 7.5 mLs by mouth every 6 (six) hours as needed for pain.   Yes Historical Provider, MD  alendronate (FOSAMAX) 70 MG tablet Take 70 mg by mouth every Monday.    Yes Historical Provider, MD  aspirin 325 MG tablet Take 325 mg by mouth every morning.    Yes Historical Provider, MD  carbidopa-levodopa (SINEMET IR) 25-100 MG per tablet Take 2 tablets by mouth See admin instructions. Take 2 tabs (50-200) by mouth 30 minutes before meals and bedtime. 7am, 11am,4pm,8pm   Yes Historical Provider, MD  cholecalciferol (VITAMIN  D) 1000 UNITS tablet Take 1,000 Units by mouth every morning.  11/27/14 11/27/15 Yes Historical Provider, MD  citalopram (CELEXA) 20 MG tablet Take 20 mg by mouth every morning.   Yes Historical Provider, MD  clonazePAM (KLONOPIN) 0.5 MG tablet Take 0.25 mg by mouth daily as needed for anxiety. Take half a tablet (0.25mg ) by mouth once daily as needed for anxiety.   Yes Historical Provider, MD  cyanocobalamin 500 MCG tablet Take 500 mcg by mouth daily with breakfast. (Vitamin B12)   Yes Historical Provider, MD  docusate sodium (COLACE) 100 MG capsule Take 200 mg by mouth at bedtime.   Yes Historical Provider, MD  donepezil (ARICEPT) 10 MG tablet Take 10 mg by mouth daily with breakfast.    Yes Historical Provider, MD  entacapone (COMTAN) 200 MG tablet Take 200 mg by mouth See admin instructions. Take 1 tablet by mouth 30 min before meals and at bedtime. 7am,11am,4pm,8pm   Yes Historical Provider, MD  folic acid (FOLVITE) 1 MG tablet Take 1 mg by mouth daily with breakfast.    Yes Historical Provider, MD  Melatonin 1 MG TABS Take 1 tablet by mouth at bedtime.   Yes Historical Provider, MD  memantine (NAMENDA) 5 MG tablet Take 5 mg by mouth daily.   Yes Historical Provider, MD  methocarbamol (ROBAXIN) 500 MG tablet Take 500 mg by mouth 4 (four) times daily -  before meals and at bedtime.  Take 1 tablet by mouth 30 min before meals and at bedtime   Yes Historical Provider, MD  Multiple Vitamin (MULTIVITAMIN WITH MINERALS) TABS tablet Take 1 tablet by mouth daily.   Yes Historical Provider, MD  OVER THE COUNTER MEDICATION Apply 1 application topically daily. Baza Cream applied to buttocks   Yes Historical Provider, MD  PRESCRIPTION MEDICATION Apply 0.5 mg topically daily as needed (agitation). (Ativan gel) 0.5 mg/ml.   Yes Historical Provider, MD  QUEtiapine (SEROQUEL) 25 MG tablet Take 12.5 mg by mouth every 12 (twelve) hours as needed (aggressive behavior). Take half a tablet (12.5mg ) by mouth every 12 hours  as needed for aggressive behavior.   Yes Historical Provider, MD  QUEtiapine (SEROQUEL) 25 MG tablet Take 75 mg by mouth at bedtime.   Yes Historical Provider, MD  lisinopril (PRINIVIL,ZESTRIL) 5 MG tablet Take 2.5 mg by mouth daily. Take Half a tablet (2.5mg ) daily    Historical Provider, MD   BP 128/76 mmHg  Pulse 77  Temp(Src) 97.6 F (36.4 C) (Axillary)  Resp 19  SpO2 100% Physical Exam  Constitutional: He appears well-developed and well-nourished.  HENT:  Head: Normocephalic and atraumatic.  Eyes: Conjunctivae and EOM are normal.  Neck: Normal range of motion. Neck supple.  Cardiovascular: Normal rate, regular rhythm and normal heart sounds.   Pulmonary/Chest: Effort normal and breath sounds normal. No respiratory distress.  Abdominal: He exhibits no distension. There is no tenderness. There is no rebound and no guarding.  Musculoskeletal: Normal range of motion.  Dislocated R hip, inferior rotation, NV intact  Neurological: He is alert.  Skin: Skin is warm and dry.  Vitals reviewed.   ED Course  Reduction of dislocation Date/Time: 04/18/2015 7:26 AM Performed by: Mirian Mo Authorized by: Mirian Mo Preparation: Patient was prepped and draped in the usual sterile fashion. Local anesthesia used: no Patient sedated: yes Sedation type: moderate (conscious) sedation Sedatives: propofol Patient tolerance: Patient tolerated the procedure well with no immediate complications   (including critical care time) Labs Review Labs Reviewed  CBC WITH DIFFERENTIAL/PLATELET - Abnormal; Notable for the following:    RBC 3.99 (*)    Hemoglobin 12.3 (*)    HCT 35.8 (*)    Neutrophils Relative % 82 (*)    Lymphocytes Relative 10 (*)    All other components within normal limits  BASIC METABOLIC PANEL - Abnormal; Notable for the following:    Potassium 3.2 (*)    Glucose, Bld 108 (*)    Calcium 8.8 (*)    All other components within normal limits  URINALYSIS, ROUTINE W  REFLEX MICROSCOPIC (NOT AT [facility]) - Abnormal; Notable for the following:    Color, Urine ORANGE (*)    APPearance CLOUDY (*)    Hgb urine dipstick SMALL (*)    Bilirubin Urine SMALL (*)    Ketones, ur 40 (*)    Nitrite POSITIVE (*)    Leukocytes, UA MODERATE (*)    All other components within normal limits  URINE MICROSCOPIC-ADD ON - Abnormal; Notable for the following:    Bacteria, UA MANY (*)    All other components within normal limits    Imaging Review Ct Head Wo Contrast  04/17/2015   CLINICAL DATA:  59 year old male status post fall.  EXAM: CT HEAD WITHOUT CONTRAST  TECHNIQUE: Contiguous axial images were obtained from the base of the skull through the vertex without intravenous contrast.  COMPARISON:  Head CT dated 04/08/2015  FINDINGS: There is slight prominence of the  ventricles and sulci compatible with age-related volume loss. Mild periventricular and deep white matter hypodensities represent chronic microvascular ischemic changes. There is no intracranial hemorrhage. No mass effect or midline shift identified.  The visualized paranasal sinuses and mastoid air cells are well aerated. The calvarium is intact.  IMPRESSION: No acute intracranial pathology.  Mild age-related atrophy and chronic microvascular ischemic disease.   Electronically Signed   By: Elgie Collard M.D.   On: 04/17/2015 15:39   Dg Hip Port Unilat With Pelvis 1v Right  04/17/2015   CLINICAL DATA:  Status post close reduction for dislocated total hip prosthesis  EXAM: RIGHT HIP (WITH PELVIS) 2 VIEW PORTABLE  COMPARISON:  Study obtained earlier in the day; April 08, 2015  FINDINGS: Frontal pelvis as well as frontal and lateral right hip images were obtained. The right hip dislocation has been reduced successful with a prosthesis now residing within the right hip joint region. There is a stable focus of calcification inferior to the prosthesis just lateral to the right ischium which may represent a chronic avulsion type  injury. No acute fracture apparent. Currently no dislocation. Left hip joint appears unremarkable.  IMPRESSION: Successful reduction of hip dislocation on the right. Prosthesis appears well-seated and aligned within the right hip joint. Question chronic avulsion inferior to the prosthesis, stable. No acute fracture or dislocation apparent currently.   Electronically Signed   By: Bretta Bang III M.D.   On: 04/17/2015 16:30   Dg Hip Unilat With Pelvis 2-3 Views Right  04/17/2015   CLINICAL DATA:  Fall today with right hip dislocation  EXAM: RIGHT HIP (WITH PELVIS) 2-3 VIEWS  COMPARISON:  04/08/2015  FINDINGS: Dislocation of the right femoral prosthesis from the acetabulum is again noted. Bony fragments are occur I again identified stable from the prior exam likely related to prior fractures. No other bony abnormality is noted.  IMPRESSION: Changes consistent with dislocation of the femoral prosthesis from the acetabulum superiorly.   Electronically Signed   By: Alcide Clever M.D.   On: 04/17/2015 13:18     EKG Interpretation None      MDM   Final diagnoses:  Status post closed reduction of dislocated total hip prothesis  Hip dislocation, right, initial encounter    Medical screening examination/treatment/procedure(s) were conducted as a shared visit with non-physician practitioner(s) and myself.  I personally evaluated the patient during the encounter.   EKG Interpretation None       Briefly, pt is a 59 y.o. male presenting with recurrent R hip dislocation despite bracing.  I performed an examination on the patient including cardiac, pulmonary, and gi systems which were unremarkable. At neuro baseline which is essentially nonverbal. R hip dislocated by exam and radiography.  Reduced as above uneventfully.  See separate documentation for sedation details.  Wu unremarkable.  Spoke with orthopedics on call who recommend fu and maintenance of brace.  DC in stable condition.    Mirian Mo, MD 04/18/15 0730

## 2015-04-18 NOTE — BH Assessment (Signed)
Seeking appropriate inpt gero placement. [facility]    Clista BernhardtNancy Talajah Slimp, [facility] Triage Specialist 04/18/2015 8:46 PM

## 2015-04-19 DIAGNOSIS — N390 Urinary tract infection, site not specified: Secondary | ICD-10-CM | POA: Diagnosis not present

## 2015-04-19 LAB — RAPID URINE DRUG SCREEN, HOSP PERFORMED
AMPHETAMINES: NOT DETECTED
BENZODIAZEPINES: NOT DETECTED
Barbiturates: NOT DETECTED
Cocaine: NOT DETECTED
Opiates: NOT DETECTED
Tetrahydrocannabinol: NOT DETECTED

## 2015-04-19 MED ORDER — CEPHALEXIN 500 MG PO CAPS
500.0000 mg | ORAL_CAPSULE | Freq: Four times a day (QID) | ORAL | Status: DC
  Administered 2015-04-19 – 2015-04-20 (×3): 500 mg via ORAL
  Filled 2015-04-19 (×3): qty 1

## 2015-04-19 NOTE — Consult Note (Signed)
  Attempted an evaluation of patient but could not proceed as he refused to participate.  Patient was awake but did not answer questions.  His Nurse stated that patient did not speak much but answered few questions regarding his return to the ER.  His disposition is inpatient Psychiatric hospitalization and his [facility]  Patient is not speaking much to staff and providers but intermittently states his needs.  No agitation or aggression noted.  We will continue to offer him his medications while we wait for placement at a Gero-psychiatric unit.  Dahlia ByesJosephine Onuoha   PMHNP-BC Patient seen face-to-face for psychiatric evaluation, chart reviewed and case discussed with the physician extender and developed treatment plan. Reviewed the information documented and agree with the treatment plan. Thedore MinsMojeed Leena Tiede, MD

## 2015-04-19 NOTE — ED Provider Notes (Signed)
I was notified by RN Kerman Passeyachel Keslar that patients urine looked abnormal.  I reviewed his recent notes and labs.  Pt had an abnormal UA on 6/28.  No culture was sent.  Will start patient on oral abx for a uti. Urine culture ordered.  Linwood DibblesJon Neev Mcmains, MD 04/19/15 1356

## 2015-04-19 NOTE — BH Assessment (Signed)
[facility] Assessment Progress Note  The following facilities have been contacted to seek placement for this pt, with results as noted:  Beds available, information sent, decision pending:  [facility]  No beds available, but accepted referrals for future consideration; information sent[facility]  [facility]  [facility]  Not able to program for patients with dementia diagnosis:  [facility]   Doylene Canninghomas Areona [place], [place] Triage Specialist [phone]

## 2015-04-19 NOTE — ED Notes (Signed)
Pt took rest of medications

## 2015-04-19 NOTE — ED Notes (Signed)
Pt took one bite of crushed medication in applesauce then refused the rest.

## 2015-04-19 NOTE — ED Notes (Signed)
Pt still has not taken applesauce with medications

## 2015-04-19 NOTE — ED Notes (Addendum)
Pt's son Edmonia CaprioSean Sunde called and wanted to speak with a Child psychotherapistsocial worker in regards to his father's disposition. Informed me that his brother was the HCP[employer] named Garry HeaterJames Beechy and his number is [phone] who lives in [place]. Was wanting to know if his father was going to be placed in a new facility for mental health or if he would return to his old nursing home facility. Son reports that the current nursing home facility has not given him any updates as to what is the process.

## 2015-04-20 DIAGNOSIS — F0391 Unspecified dementia with behavioral disturbance: Secondary | ICD-10-CM

## 2015-04-20 DIAGNOSIS — N390 Urinary tract infection, site not specified: Secondary | ICD-10-CM | POA: Diagnosis not present

## 2015-04-20 NOTE — Progress Notes (Signed)
CSW spoke with [place]issa who states that she is Catering managerthe director at [facility]. Efraim KaufmannMelissa states that she is arranging 24 hour sitters for the patient and that she will calling back within an hour once the sitters are in place.  Trish MageBrittney Lynnix Schoneman, LCSWA [phone] ED CSW 04/20/2015 3:52 PM

## 2015-04-20 NOTE — [facility] Suicide Risk Assessment (Signed)
Suicide Risk Assessment  Discharge Assessment   [facility]   Demographic Factors:  Male and Caucasian  Total Time spent with patient: 30 minutes  Musculoskeletal: Strength & Muscle Tone: decreased and atrophy Gait & Station: unable to stand Patient leans: Left  Psychiatric Specialty Exam: Physical Exam  Review of Systems  Constitutional: Negative.   HENT: Negative.   Eyes: Negative.   Respiratory: Negative.   Cardiovascular: Negative.   Gastrointestinal: Negative.   Genitourinary: Negative.   Musculoskeletal: Negative.   Skin: Negative.   Neurological: Negative.   Endo/Heme/Allergies: Negative.   Psychiatric/Behavioral: Positive for memory loss.    Blood pressure 112/75, pulse 82, temperature 98.4 F (36.9 C), temperature source Oral, resp. rate 20, SpO2 95 %.There is no weight on file to calculate BMI.  General Appearance: Casual  Eye Contact::  Fair  Speech:  Slow  Volume:  Decreased  Mood:  Euthymic  Affect:  Flat  Thought Process:  confused, memory loss  Orientation:  Other:  oriented to self  Thought Content:  WDL  Suicidal Thoughts:  No  Homicidal Thoughts:  No  Memory:  Immediate;   Poor Recent;   Poor Remote;   Poor  Judgement:  Impaired  Insight:  Lacking  Psychomotor Activity:  Decreased  Concentration:  Poor  Recall:  Poor  Fund of Knowledge:Fair  Language: Poor  Akathisia:  No  Handed:  Right  AIMS (if indicated):     Assets:  Housing Resilience Social Support  ADL's:  Impaired  Cognition: Impaired,  Moderate  Sleep:      Has this patient used any form of tobacco in the last 30 days? (Cigarettes, Smokeless Tobacco, Cigars, and/or Pipes) No  Mental Status Per Nursing Assessment::   On Admission:   suicidal ideations  Current Mental Status by Physician: NA  Loss Factors: NA  Historical Factors: NA  Risk Reduction Factors:   Sense of responsibility to family, Living with another person, especially a  relative and Positive social support  Continued Clinical Symptoms:  None  Cognitive Features That Contribute To Risk:  None    Suicide Risk:  Minimal: No identifiable suicidal ideation.  Patients presenting with no risk factors but with morbid ruminations; may be classified as minimal risk based on the severity of the depressive symptoms  Principal Problem: Dementia with aggressive behavior Discharge Diagnoses:  Patient Active Problem List   Diagnosis Date Noted  . 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 UTI (lower urinary tract infection) [N39.0]   . Mood disorder [F39] 11/16/2014  . Agitation [R45.1]   . Dementia [F03.90] 10/19/2013  . AAA (abdominal aortic aneurysm) without rupture [I71.4] 10/19/2013  . Leukocytosis, unspecified [D72.829] 10/19/2013  . Hypokalemia [E87.6] 10/19/2013  . Diverticulitis [K57.92] 10/17/2013  . Hyponatremia [E87.1] 01/06/2013  . Parkinson disease [G20] 01/04/2013  . Fracture of femoral neck, right [S72.001A] 01/04/2013      Plan Of Care/Follow-up recommendations:  Activity:  as tolerated Diet:  heart healthy diet  Is patient on multiple antipsychotic therapies at discharge:  No   Has Patient had three or more failed trials of antipsychotic monotherapy by history:  No  Recommended Plan for Multiple Antipsychotic Therapies: NA    Larry Daniel, PMH-NP 04/20/2015, 11:02 AM

## 2015-04-20 NOTE — ED Notes (Signed)
Bed: [facility] Expected date:  Expected time:  Means of arrival:  Comments: Hold for TCU 

## 2015-04-20 NOTE — ED Notes (Signed)
MD at bedside. 

## 2015-04-20 NOTE — Progress Notes (Signed)
CSW is awaiting call back from son with updated conversation between him and facility.  Trish MageBrittney Abigael Daniel, LCSWA [phone] ED CSW 04/20/2015 3:01 PM
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
## 2015-04-20 NOTE — ED Notes (Signed)
Redness noted to contact areas of the biotech brace. Allevyn dressing placed to areas above bilateral hips and right anterior hip.
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
## 2015-04-20 NOTE — Progress Notes (Signed)
CSW spoke with Efraim KaufmannMelissa who is the Interior and spatial designerDirector of Nursing at [facility]. She states that she has made the arrangements to get the pt sitters and the pt is welcomed back.   CSW has consulted with nurse. Nurse states she will call transportation for patient.  Trish MageBrittney Zaelyn Barbary, LCSWA [phone] ED CSW 04/20/2015 4:13 PM

## 2015-04-20 NOTE — Progress Notes (Signed)
CSW spoke with the patient's son. Son states that the facility would like another level of care for the patient.  CSW Spoke with son and informed him that the pt has been medically and psychiatrically cleared for discharge. Son stated that the facility told him that the pt may need a higher level of care. Son states he is now also interested in a higher level of care for the patient.  CSW provided emotional support for son through the phone and expressed understanding. CSW informed son that she would also call facility to speak with the their social worker and director to inform them that they will need to look for a higher level of care for the patient.  Trish MageBrittney Hendricks Schwandt, LCSWA [phone] ED CSW 04/20/2015 3:01 PM

## 2015-04-21 LAB — URINE CULTURE

## 2015-05-27 ENCOUNTER — Encounter (HOSPITAL_COMMUNITY): Payer: Self-pay | Admitting: Emergency Medicine

## 2015-05-27 ENCOUNTER — Emergency Department (HOSPITAL_COMMUNITY)
Admission: EM | Admit: 2015-05-27 | Discharge: 2015-05-28 | Disposition: A | Discharge status: Skilled Nursing Facility | Payer: Medicare Other | Attending: Emergency Medicine | Admitting: Emergency Medicine

## 2015-05-27 DIAGNOSIS — F419 Anxiety disorder, unspecified: Secondary | ICD-10-CM | POA: Diagnosis not present

## 2015-05-27 DIAGNOSIS — F329 Major depressive disorder, single episode, unspecified: Secondary | ICD-10-CM | POA: Insufficient documentation

## 2015-05-27 DIAGNOSIS — Z8639 Personal history of other endocrine, nutritional and metabolic disease: Secondary | ICD-10-CM | POA: Insufficient documentation

## 2015-05-27 DIAGNOSIS — Z7982 Long term (current) use of aspirin: Secondary | ICD-10-CM | POA: Insufficient documentation

## 2015-05-27 DIAGNOSIS — G309 Alzheimer's disease, unspecified: Secondary | ICD-10-CM | POA: Insufficient documentation

## 2015-05-27 DIAGNOSIS — Z79899 Other long term (current) drug therapy: Secondary | ICD-10-CM | POA: Diagnosis not present

## 2015-05-27 DIAGNOSIS — F0280 Dementia in other diseases classified elsewhere without behavioral disturbance: Secondary | ICD-10-CM | POA: Insufficient documentation

## 2015-05-27 DIAGNOSIS — G20 Parkinson's disease: Secondary | ICD-10-CM | POA: Insufficient documentation

## 2015-05-27 DIAGNOSIS — W19XXXA Unspecified fall, initial encounter: Secondary | ICD-10-CM

## 2015-05-27 DIAGNOSIS — Z043 Encounter for examination and observation following other accident: Secondary | ICD-10-CM | POA: Insufficient documentation

## 2015-05-27 DIAGNOSIS — I10 Essential (primary) hypertension: Secondary | ICD-10-CM | POA: Diagnosis not present

## 2015-05-27 NOTE — ED Notes (Addendum)
Arrived via EMS from SNF. Per EMS pt had a unwitnessed fall from standing height. States pt uses walker to ambulate. States pt mumbles when speaking and is difficult to understand. Pt has recent hx of right hip surgery. Per EMS pt is at baseline and denies LOC.  EMS reports pt has hx of dementia.

## 2015-05-28 ENCOUNTER — Emergency Department (HOSPITAL_COMMUNITY): Payer: Medicare Other

## 2015-05-28 DIAGNOSIS — Z043 Encounter for examination and observation following other accident: Secondary | ICD-10-CM | POA: Diagnosis not present

## 2015-05-28 NOTE — Discharge Instructions (Signed)
Return here as needed.  Follow-up with your primary care doctor °

## 2015-05-28 NOTE — ED Notes (Signed)
Report called to [phone].

## 2015-05-28 NOTE — ED Provider Notes (Signed)
Medical screening examination/treatment/procedure(s) were conducted as a shared visit with non-physician practitioner(s) and myself.  I personally evaluated the patient during the encounter.   EKG Interpretation None      2:01 AM HPI Comments: Larry Daniel is a 59 y.o. male brought in by ambulance, who presents to the Emergency Department complaining of a fall that occurred pta.  Coming from nursing home. Falls was not witnessed. Per EMS pt is at baseline with his dementia.    On exam; demented, no obvious signs of trauma; in no distress; hemodynamically stable; moving all extremities equally.    CT of head and cervical spine show no acute injury. We'll discharge back to nursing home.  Layla Maw Ward, DO 05/28/15 [phone]

## 2015-05-28 NOTE — ED Provider Notes (Signed)
CSN: [id number]     Arrival date & time 05/27/15  2336 History   First MD Initiated Contact with Patient 05/28/15 0021     Chief Complaint  Patient presents with  . Fall     (Consider location/radiation/quality/duration/timing/severity/associated sxs/prior Treatment) HPI   Patient is a 59 year old male with PMHx of Parkinson's disease, Alzheimer's dementia, depression, anxiety, s/p recent R hip arthroplasty who presents after unwitnessed fall at SNF.  Patient was brought to ED by EMS who state patient was found down at SNF, suspected fall from standing height.  Patient in unable to contribute to history due to dementia.  He reports pain but is unable to specify location.  Available records reviewed.   Past Medical History  Diagnosis Date  . Parkinson disease   . Depression   . Alzheimer's dementia   . Anxiety   . Hyperlipidemia   . Hypertension   . Neuromuscular disorder    Past Surgical History  Procedure Laterality Date  . Cervical fusion    . Hip arthroplasty Right 01/04/2013    Procedure: ARTHROPLASTY BIPOLAR HIP;  Surgeon: Verlee Rossetti, MD;  Location: [facility];  Service: Orthopedics;  Laterality: Right;   Family History  Problem Relation Age of Onset  . Lung cancer Paternal Grandmother    History  Substance Use Topics  . Smoking status: Never Smoker   . Smokeless tobacco: Never Used  . Alcohol Use: No    Review of Systems Level V caveat applies due to dementia  Allergies  Review of patient's allergies indicates no known allergies.  Home Medications   Prior to Admission medications   Medication Sig Start Date End Date Taking? Authorizing Provider  acetaminophen (TYLENOL) 325 MG tablet Take 650 mg by mouth 4 (four) times daily -  before meals and at bedtime. Take 2 tablet by mouth 30 min before meals and at bedtime   Yes Historical Provider, MD  aspirin 325 MG tablet Take 325 mg by mouth every morning.    Yes Historical Provider, MD  carbidopa-levodopa (SINEMET  IR) 25-100 MG per tablet Take 2 tablets by mouth See admin instructions. Take 2 tabs (50-200) by mouth 30 minutes before meals and bedtime. 7am, 11am,4pm,8pm   Yes Historical Provider, MD  cholecalciferol (VITAMIN D) 1000 UNITS tablet Take 1,000 Units by mouth every morning.  11/27/14 11/27/15 Yes Historical Provider, MD  citalopram (CELEXA) 20 MG tablet Take 20 mg by mouth every morning.   Yes Historical Provider, MD  cyanocobalamin 500 MCG tablet Take 500 mcg by mouth daily with breakfast. (Vitamin B12)   Yes Historical Provider, MD  docusate sodium (COLACE) 100 MG capsule Take 200 mg by mouth at bedtime.   Yes Historical Provider, MD  entacapone (COMTAN) 200 MG tablet Take 200 mg by mouth See admin instructions. Take 1 tablet by mouth 30 min before meals and at bedtime. 7am,11am,4pm,8pm   Yes Historical Provider, MD  feeding supplement, ENSURE, (ENSURE) PUDG Take 1 Container by mouth 2 (two) times daily between meals.   Yes Historical Provider, MD  Melatonin 1 MG TABS Take 1 tablet by mouth at bedtime.   Yes Historical Provider, MD  memantine (NAMENDA) 5 MG tablet Take 5 mg by mouth daily.   Yes Historical Provider, MD  methocarbamol (ROBAXIN) 500 MG tablet Take 500 mg by mouth 4 (four) times daily -  before meals and at bedtime. Take 1 tablet by mouth 30 min before meals and at bedtime   Yes Historical Provider, MD  Multiple Vitamin (MULTIVITAMIN WITH MINERALS) TABS tablet Take 1 tablet by mouth daily.   Yes Historical Provider, MD  PRESCRIPTION MEDICATION Apply 0.5 mg topically daily as needed (agitation). (Ativan gel) 0.5 mg/ml.   Yes Historical Provider, MD  QUEtiapine (SEROQUEL) 25 MG tablet Take 75 mg by mouth at bedtime.   Yes Historical Provider, MD  acetaminophen-codeine (CAPITAL/CODEINE) 120-12 MG/5ML suspension Take 7.5 mLs by mouth every 6 (six) hours as needed for pain.    Historical Provider, MD  alendronate (FOSAMAX) 70 MG tablet Take 70 mg by mouth every Monday.     Historical Provider,  MD  clonazePAM (KLONOPIN) 0.5 MG tablet Take 0.25 mg by mouth daily as needed for anxiety. Take half a tablet (0.25mg ) by mouth once daily as needed for anxiety.    Historical Provider, MD  donepezil (ARICEPT) 10 MG tablet Take 10 mg by mouth daily with breakfast.     Historical Provider, MD  folic acid (FOLVITE) 1 MG tablet Take 1 mg by mouth daily with breakfast.     Historical Provider, MD  lisinopril (PRINIVIL,ZESTRIL) 5 MG tablet Take 2.5 mg by mouth daily. Take Half a tablet (2.5mg ) daily    Historical Provider, MD  OVER THE COUNTER MEDICATION Apply 1 application topically daily. Baza Cream applied to buttocks    Historical Provider, MD  QUEtiapine (SEROQUEL) 25 MG tablet Take 12.5 mg by mouth every 12 (twelve) hours as needed (aggressive behavior). Take half a tablet (12.5mg ) by mouth every 12 hours as needed for aggressive behavior.    Historical Provider, MD   BP 108/62 mmHg  Pulse 82  SpO2 95% Physical Exam  Constitutional: He appears well-developed. No distress.  Thin-appearing  HENT:  Head: Normocephalic.  Mouth/Throat: Oropharynx is clear and moist.  Eyes: Conjunctivae and EOM are normal. Pupils are equal, round, and reactive to light. Right eye exhibits no discharge. Left eye exhibits no discharge.  Cardiovascular: Normal rate, regular rhythm and normal heart sounds.  Exam reveals no gallop and no friction rub.   No murmur heard. Pulmonary/Chest: Effort normal and breath sounds normal. No respiratory distress. He has no wheezes. He has no rales.  Abdominal: Soft. Bowel sounds are normal. He exhibits no distension. There is no tenderness.  Neurological: He is alert. He exhibits normal muscle tone. Coordination normal.  Skin: Skin is warm and dry. No rash noted. No erythema.  Psychiatric: Cognition and memory are impaired (Patient unable to recount events surrounding fall).  Nursing note and vitals reviewed.     ED Course  Procedures (including critical care time) Labs  Review Labs Reviewed - No data to display  Imaging Review Ct Head Wo Contrast  05/28/2015   CLINICAL DATA:  Pain following fall  EXAM: CT HEAD WITHOUT CONTRAST  CT CERVICAL SPINE WITHOUT CONTRAST  TECHNIQUE: Multidetector CT imaging of the head and cervical spine was performed following the standard protocol without intravenous contrast. Multiplanar CT image reconstructions of the cervical spine were also generated.  COMPARISON:  Head CT April 17, 2015; cervical spine CT April 08, 2015  FINDINGS: CT HEAD FINDINGS  Age related volume loss is stable. There is no intracranial mass, hemorrhage, extra-axial fluid collection, or midline shift. There is slight small vessel disease in the centra semiovale bilaterally. No new gray-white compartment lesion. No acute infarct evident. Bony calvarium appears intact. Mastoid air cells are clear. There is opacification in the inferior left maxillary antrum. There is debris in both external auditory canals.  CT CERVICAL SPINE FINDINGS  Patient is status post anterior fusion from C5-C7 with the screw and plate fixation device intact. Bony plugs at C5-6 and C6-7 are also intact. There is fairly severe narrowing at C7-T1, stable. Moderate narrowing at C5-6 and C6-7 is stable. Slightly milder narrowing at C4-5 anteriorly is also stable. There is facet hypertrophy at most levels bilaterally. No frank disc extrusion or stenosis.  IMPRESSION: CT head: Age related volume loss with slight periventricular small vessel disease. No intracranial mass, hemorrhage, or extra-axial fluid collection. No acute appearing infarct. Mild inferior left maxillary sinus disease. Probable cerumen in each external auditory canal.  CT cervical spine: Stable postoperative change. Multilevel osteoarthritic change. No fracture or spondylolisthesis. No appreciable change from recent prior study.   Electronically Signed   By: Bretta Bang III M.D.   On: 05/28/2015 02:44   Ct Cervical Spine Wo  Contrast  05/28/2015   CLINICAL DATA:  Pain following fall  EXAM: CT HEAD WITHOUT CONTRAST  CT CERVICAL SPINE WITHOUT CONTRAST  TECHNIQUE: Multidetector CT imaging of the head and cervical spine was performed following the standard protocol without intravenous contrast. Multiplanar CT image reconstructions of the cervical spine were also generated.  COMPARISON:  Head CT April 17, 2015; cervical spine CT April 08, 2015  FINDINGS: CT HEAD FINDINGS  Age related volume loss is stable. There is no intracranial mass, hemorrhage, extra-axial fluid collection, or midline shift. There is slight small vessel disease in the centra semiovale bilaterally. No new gray-white compartment lesion. No acute infarct evident. Bony calvarium appears intact. Mastoid air cells are clear. There is opacification in the inferior left maxillary antrum. There is debris in both external auditory canals.  CT CERVICAL SPINE FINDINGS  Patient is status post anterior fusion from C5-C7 with the screw and plate fixation device intact. Bony plugs at C5-6 and C6-7 are also intact. There is fairly severe narrowing at C7-T1, stable. Moderate narrowing at C5-6 and C6-7 is stable. Slightly milder narrowing at C4-5 anteriorly is also stable. There is facet hypertrophy at most levels bilaterally. No frank disc extrusion or stenosis.  IMPRESSION: CT head: Age related volume loss with slight periventricular small vessel disease. No intracranial mass, hemorrhage, or extra-axial fluid collection. No acute appearing infarct. Mild inferior left maxillary sinus disease. Probable cerumen in each external auditory canal.  CT cervical spine: Stable postoperative change. Multilevel osteoarthritic change. No fracture or spondylolisthesis. No appreciable change from recent prior study.   Electronically Signed   By: Bretta Bang III M.D.   On: 05/28/2015 02:44    Patient will be discharged back to the nursing facility.  No significant abnormalities noted on exam or  CT scans     Charlestine Night, PA-C 05/28/15 [phone]

## 2015-05-30 ENCOUNTER — Encounter (HOSPITAL_COMMUNITY): Payer: Self-pay | Admitting: Emergency Medicine

## 2015-05-30 ENCOUNTER — Emergency Department (HOSPITAL_COMMUNITY)
Admission: EM | Admit: 2015-05-30 | Discharge: 2015-05-31 | Disposition: A | Discharge status: Nursing Home (Includes ICF) | Payer: Medicare Other | Attending: Emergency Medicine | Admitting: Emergency Medicine

## 2015-05-30 DIAGNOSIS — I10 Essential (primary) hypertension: Secondary | ICD-10-CM | POA: Diagnosis not present

## 2015-05-30 DIAGNOSIS — Z7982 Long term (current) use of aspirin: Secondary | ICD-10-CM | POA: Insufficient documentation

## 2015-05-30 DIAGNOSIS — Y998 Other external cause status: Secondary | ICD-10-CM | POA: Diagnosis not present

## 2015-05-30 DIAGNOSIS — F0280 Dementia in other diseases classified elsewhere without behavioral disturbance: Secondary | ICD-10-CM | POA: Insufficient documentation

## 2015-05-30 DIAGNOSIS — Z8639 Personal history of other endocrine, nutritional and metabolic disease: Secondary | ICD-10-CM | POA: Diagnosis not present

## 2015-05-30 DIAGNOSIS — G20 Parkinson's disease: Secondary | ICD-10-CM | POA: Insufficient documentation

## 2015-05-30 DIAGNOSIS — Z043 Encounter for examination and observation following other accident: Secondary | ICD-10-CM | POA: Insufficient documentation

## 2015-05-30 DIAGNOSIS — G309 Alzheimer's disease, unspecified: Secondary | ICD-10-CM | POA: Diagnosis not present

## 2015-05-30 DIAGNOSIS — Y92129 Unspecified place in nursing home as the place of occurrence of the external cause: Secondary | ICD-10-CM | POA: Insufficient documentation

## 2015-05-30 DIAGNOSIS — Y9389 Activity, other specified: Secondary | ICD-10-CM | POA: Diagnosis not present

## 2015-05-30 DIAGNOSIS — Z79899 Other long term (current) drug therapy: Secondary | ICD-10-CM | POA: Insufficient documentation

## 2015-05-30 DIAGNOSIS — N390 Urinary tract infection, site not specified: Secondary | ICD-10-CM | POA: Diagnosis not present

## 2015-05-30 DIAGNOSIS — F329 Major depressive disorder, single episode, unspecified: Secondary | ICD-10-CM | POA: Diagnosis not present

## 2015-05-30 DIAGNOSIS — W19XXXA Unspecified fall, initial encounter: Secondary | ICD-10-CM

## 2015-05-30 DIAGNOSIS — F419 Anxiety disorder, unspecified: Secondary | ICD-10-CM | POA: Diagnosis not present

## 2015-05-30 DIAGNOSIS — Y92009 Unspecified place in unspecified non-institutional (private) residence as the place of occurrence of the external cause: Secondary | ICD-10-CM

## 2015-05-30 DIAGNOSIS — W1839XA Other fall on same level, initial encounter: Secondary | ICD-10-CM | POA: Diagnosis not present

## 2015-05-30 NOTE — ED Notes (Signed)
Pt presents to ED via EMS after an unwitnessed fall at [facility]  Staff at the facility stated that it is estimated that he was in the floor for 5-10 minutes before a resident walked by his room and found him in the floor holding his head.  Pt is mostly nonverbal at baseline and is responsive only to verbal stimuli although he is alert.  He is disoriented.  Pt did state that he has no pain in his head at this time, but there is what appears to be an old quarter-sized bruise on his right forehead and another quarter-sized swollen area over his left eyebrow.  He has fallen at least three times in the past week and has limited independent mobility, plus his disorientation and failure to follow commands make him a very high fall risk.  Pt was attempting to climb off the lower edge of the bed during assessment.  Side rails are up and charge nurse has been notified of fall risk.

## 2015-05-30 NOTE — ED Notes (Signed)
Bed: WA12 Expected date:  Expected time:  Means of arrival:  Comments: Fall 

## 2015-05-31 ENCOUNTER — Emergency Department (HOSPITAL_COMMUNITY): Payer: Medicare Other

## 2015-05-31 DIAGNOSIS — Z043 Encounter for examination and observation following other accident: Secondary | ICD-10-CM | POA: Diagnosis not present

## 2015-05-31 LAB — URINALYSIS, ROUTINE W REFLEX MICROSCOPIC
GLUCOSE, UA: NEGATIVE mg/dL
Ketones, ur: 15 mg/dL — AB
Nitrite: POSITIVE — AB
PROTEIN: NEGATIVE mg/dL
SPECIFIC GRAVITY, URINE: 1.041 — AB (ref 1.005–1.030)
Urobilinogen, UA: 1 mg/dL (ref 0.0–1.0)
pH: 5.5 (ref 5.0–8.0)

## 2015-05-31 LAB — COMPREHENSIVE METABOLIC PANEL
ALBUMIN: 3.4 g/dL — AB (ref 3.5–5.0)
ALK PHOS: 63 U/L (ref 38–126)
ANION GAP: 8 (ref 5–15)
AST: 19 U/L (ref 15–41)
BILIRUBIN TOTAL: 0.9 mg/dL (ref 0.3–1.2)
BUN: 24 mg/dL — ABNORMAL HIGH (ref 6–20)
CHLORIDE: 105 mmol/L (ref 101–111)
CO2: 32 mmol/L (ref 22–32)
Calcium: 9 mg/dL (ref 8.9–10.3)
Creatinine, Ser: 0.73 mg/dL (ref 0.61–1.24)
GFR calc Af Amer: >60 mL/min (ref >60)
Glucose, Bld: 106 mg/dL — ABNORMAL HIGH (ref 65–99)
POTASSIUM: 4.2 mmol/L (ref 3.5–5.1)
SODIUM: 145 mmol/L (ref 135–145)
Total Protein: 6.5 g/dL (ref 6.5–8.1)

## 2015-05-31 LAB — CBC WITH DIFFERENTIAL/PLATELET
BASOS ABS: 0 10*3/uL (ref 0.0–0.1)
BASOS PCT: 1 % (ref 0–1)
Eosinophils Absolute: 0.2 10*3/uL (ref 0.0–0.7)
Eosinophils Relative: 3 % (ref 0–5)
HEMATOCRIT: 33.8 % — AB (ref 39.0–52.0)
HEMOGLOBIN: 11.1 g/dL — AB (ref 13.0–17.0)
Lymphocytes Relative: 25 % (ref 12–46)
Lymphs Abs: 1.6 10*3/uL (ref 0.7–4.0)
MCH: 30.6 pg (ref 26.0–34.0)
MCHC: 32.8 g/dL (ref 30.0–36.0)
MCV: 93.1 fL (ref 78.0–100.0)
MONOS PCT: 10 % (ref 3–12)
Monocytes Absolute: 0.7 10*3/uL (ref 0.1–1.0)
NEUTROS ABS: 3.9 10*3/uL (ref 1.7–7.7)
NEUTROS PCT: 61 % (ref 43–77)
PLATELETS: 188 10*3/uL (ref 150–400)
RBC: 3.63 MIL/uL — ABNORMAL LOW (ref 4.22–5.81)
RDW: 13 % (ref 11.5–15.5)
WBC: 6.3 10*3/uL (ref 4.0–10.5)

## 2015-05-31 LAB — URINE MICROSCOPIC-ADD ON

## 2015-05-31 MED ORDER — CEPHALEXIN 500 MG PO CAPS
500.0000 mg | ORAL_CAPSULE | Freq: Once | ORAL | Status: AC
  Administered 2015-05-31: 500 mg via ORAL
  Filled 2015-05-31: qty 1

## 2015-05-31 MED ORDER — CEPHALEXIN 500 MG PO CAPS: 500.0000 mg | ORAL_CAPSULE | Freq: Three times a day (TID) | ORAL | Status: AC

## 2015-05-31 MED ORDER — LORAZEPAM 2 MG/ML IJ SOLN
1.0000 mg | Freq: Once | INTRAMUSCULAR | Status: AC
  Administered 2015-05-31: 1 mg via INTRAMUSCULAR
  Filled 2015-05-31: qty 1

## 2015-05-31 NOTE — ED Notes (Signed)
Notified PT[facility] of discharge.

## 2015-05-31 NOTE — Discharge Instructions (Signed)
Larry Daniel has a urinary tract infection. Give him the antibiotics until gone. A urine culture was sent and can be checked in about 2 days. Return to the ED for fever or any problems listed on the head injury sheet.

## 2015-05-31 NOTE — ED Provider Notes (Signed)
CSN: [id number]     Arrival date & time 05/30/15  2259 History  This chart was scribed for Larry Albe, MD by Doreatha Martin, ED Scribe. This patient was seen in room WA12/WA12 and the patient's care was started at 12:34 AM.      Chief Complaint  Patient presents with  . Fall   The history is provided by the EMS personnel. The history is limited by the condition of the patient and the absence of a caregiver. No language interpreter was used.    LEVEL 5 CAVEAT: HPI and ROS limited due to dementia and condition of the pt. Nonverbal upon exam.    HPI Comments: Larry Daniel is a 59 y.o. male BIBA with Hx of parkinson disease, depression, alzheimer's dementia, anxiety, HLD, HTN, neuromuscular disorder who presents to the Emergency Department complaining of an unwitnessed fall that occurred earlier this evening at a nursing facility. Pt has a Hx of falls, with a recent visit to the ED on 05/28/15. Pt does not follow commands or answer questions.     Past Medical History  Diagnosis Date  . Parkinson disease   . Depression   . Alzheimer's dementia   . Anxiety   . Hyperlipidemia   . Hypertension   . Neuromuscular disorder    Past Surgical History  Procedure Laterality Date  . Cervical fusion    . Hip arthroplasty Right 01/04/2013    Procedure: ARTHROPLASTY BIPOLAR HIP;  Surgeon: Verlee Rossetti, MD;  Location: [facility];  Service: Orthopedics;  Laterality: Right;   Family History  Problem Relation Age of Onset  . Lung cancer Paternal Grandmother    Social History  Substance Use Topics  . Smoking status: Never Smoker   . Smokeless tobacco: Never Used  . Alcohol Use: No  lives in NH  Review of Systems  Unable to perform ROS: Dementia   Allergies  Review of patient's allergies indicates no known allergies.  Home Medications   Prior to Admission medications   Medication Sig Start Date End Date Taking? Authorizing Provider  acetaminophen-codeine (CAPITAL/CODEINE) 120-12 MG/5ML  suspension Take 7.5 mLs by mouth every 6 (six) hours as needed for pain.   Yes Historical Provider, MD  alendronate (FOSAMAX) 70 MG tablet Take 70 mg by mouth every Monday.    Yes Historical Provider, MD  aspirin 325 MG tablet Take 325 mg by mouth every morning.    Yes Historical Provider, MD  carbidopa-levodopa (SINEMET IR) 25-100 MG per tablet Take 2 tablets by mouth See admin instructions. Take 2 tabs (50-200) by mouth 30 minutes before meals and bedtime. 7am, 11am,4pm,8pm   Yes Historical Provider, MD  cholecalciferol (VITAMIN D) 1000 UNITS tablet Take 1,000 Units by mouth every morning.  11/27/14 11/27/15 Yes Historical Provider, MD  citalopram (CELEXA) 20 MG tablet Take 20 mg by mouth every morning.   Yes Historical Provider, MD  clonazePAM (KLONOPIN) 0.5 MG tablet Take 0.25 mg by mouth daily as needed for anxiety. Take half a tablet (0.25mg ) by mouth once daily as needed for anxiety.   Yes Historical Provider, MD  cyanocobalamin 500 MCG tablet Take 500 mcg by mouth daily with breakfast. (Vitamin B12)   Yes Historical Provider, MD  docusate sodium (COLACE) 100 MG capsule Take 200 mg by mouth at bedtime.   Yes Historical Provider, MD  donepezil (ARICEPT) 10 MG tablet Take 10 mg by mouth daily with breakfast.    Yes Historical Provider, MD  entacapone (COMTAN) 200 MG tablet Take 200 mg  by mouth See admin instructions. Take 1 tablet by mouth 30 min before meals and at bedtime. 7am,11am,4pm,8pm   Yes Historical Provider, MD  feeding supplement, ENSURE, (ENSURE) PUDG Take 1 Container by mouth 2 (two) times daily between meals.   Yes Historical Provider, MD  Melatonin 1 MG TABS Take 1 tablet by mouth at bedtime.   Yes Historical Provider, MD  memantine (NAMENDA) 10 MG tablet Take 10 mg by mouth 2 (two) times daily.   Yes Historical Provider, MD  methocarbamol (ROBAXIN) 500 MG tablet Take 500 mg by mouth 4 (four) times daily -  before meals and at bedtime. Take 1 tablet by mouth 30 min before meals and at  bedtime   Yes Historical Provider, MD  Multiple Vitamin (MULTIVITAMIN WITH MINERALS) TABS tablet Take 1 tablet by mouth daily.   Yes Historical Provider, MD  PRESCRIPTION MEDICATION Apply 0.5 mg topically daily as needed (agitation). (Ativan gel) 0.5 mg/ml.   Yes Historical Provider, MD  QUEtiapine (SEROQUEL) 25 MG tablet Take 12.5 mg by mouth every 12 (twelve) hours as needed (aggressive behavior). Take half a tablet (12.5mg ) by mouth every 12 hours as needed for aggressive behavior.   Yes Historical Provider, MD  QUEtiapine (SEROQUEL) 25 MG tablet Take 75 mg by mouth at bedtime.   Yes Historical Provider, MD  acetaminophen (TYLENOL) 325 MG tablet Take 650 mg by mouth 4 (four) times daily -  before meals and at bedtime. Take 2 tablet by mouth 30 min before meals and at bedtime    Historical Provider, MD  cephALEXin (KEFLEX) 500 MG capsule Take 1 capsule (500 mg total) by mouth 3 (three) times daily. 05/31/15   Larry Albe, MD  lisinopril (PRINIVIL,ZESTRIL) 5 MG tablet Take 2.5 mg by mouth daily. Take Half a tablet (2.5mg ) daily    Historical Provider, MD   BP 117/68 mmHg  Pulse 76  Temp(Src) 97.4 F (36.3 C) (Axillary)  Resp 16  SpO2 94%  Vital signs normal   Physical Exam  Constitutional: He is oriented to person, place, and time. He appears well-developed and well-nourished.  Non-toxic appearance. He does not appear ill. No distress.  Uncooperative, pt will not open his eyes or mouth. PT trying to sit up, with a lot of coaching he finally laid down  HENT:  Head: Normocephalic and atraumatic.  Right Ear: External ear normal.  Left Ear: External ear normal.  Nose: Nose normal. No mucosal edema or rhinorrhea.  Mouth/Throat: Oropharynx is clear and moist and mucous membranes are normal. No dental abscesses or uvula swelling.  Eyes: Conjunctivae and EOM are normal. Pupils are equal, round, and reactive to light.  Neck: Normal range of motion and full passive range of motion without pain. Neck  supple.  Cardiovascular: Normal rate, regular rhythm and normal heart sounds.  Exam reveals no gallop and no friction rub.   No murmur heard. Pulmonary/Chest: Effort normal and breath sounds normal. No respiratory distress. He has no wheezes. He has no rhonchi. He has no rales. He exhibits no tenderness and no crepitus.  Abdominal: Soft. Normal appearance and bowel sounds are normal. He exhibits no distension. There is no tenderness. There is no rebound and no guarding.  Musculoskeletal: Normal range of motion. He exhibits no edema or tenderness.  Moves all extremities well.   Neurological: He is alert and oriented to person, place, and time. He has normal strength. No cranial nerve deficit.  Skin: Skin is warm, dry and intact. No rash noted. No erythema.  No pallor.  Faint old bruising and abrasions to his knees.   Psychiatric: He is slowed. He is noncommunicative.  Nursing note and vitals reviewed.  ED Course  Procedures (including critical care time)  Medications  LORazepam (ATIVAN) injection 1 mg (1 mg Intramuscular Given 05/31/15 0057)  cephALEXin (KEFLEX) capsule 500 mg (500 mg Oral Given 05/31/15 0339)    DIAGNOSTIC STUDIES: Oxygen Saturation is 94% on RA, adequate by my interpretation.    COORDINATION OF CARE: 12:39 AM given Ativan so he would hold still for his testing.  3:55 AM Pt resting quietly. He is going to be discharged to his facility with antibiotics for a UTI.   Labs Review Results for orders placed or performed during the hospital encounter of 05/30/15  Comprehensive metabolic panel  Result Value Ref Range   Sodium 145 135 - 145 mmol/L   Potassium 4.2 3.5 - 5.1 mmol/L   Chloride 105 101 - 111 mmol/L   CO2 32 22 - 32 mmol/L   Glucose, Bld 106 (H) 65 - 99 mg/dL   BUN 24 (H) 6 - 20 mg/dL   Creatinine, Ser 1.61 0.61 - 1.24 mg/dL   Calcium 9.0 8.9 - 09.6 mg/dL   Total Protein 6.5 6.5 - 8.1 g/dL   Albumin 3.4 (L) 3.5 - 5.0 g/dL   AST 19 15 - 41 U/L   ALT <5 (L)  17 - 63 U/L   Alkaline Phosphatase 63 38 - 126 U/L   Total Bilirubin 0.9 0.3 - 1.2 mg/dL   GFR calc non Af Amer >60 >60 mL/min   GFR calc Af Amer >60 >60 mL/min   Anion gap 8 5 - 15  CBC with Differential  Result Value Ref Range   WBC 6.3 4.0 - 10.5 K/uL   RBC 3.63 (L) 4.22 - 5.81 MIL/uL   Hemoglobin 11.1 (L) 13.0 - 17.0 g/dL   HCT 04.5 (L) 40.9 - 81.1 %   MCV 93.1 78.0 - 100.0 fL   MCH 30.6 26.0 - 34.0 pg   MCHC 32.8 30.0 - 36.0 g/dL   RDW 91.4 78.2 - 95.6 %   Platelets 188 150 - 400 K/uL   Neutrophils Relative % 61 43 - 77 %   Neutro Abs 3.9 1.7 - 7.7 K/uL   Lymphocytes Relative 25 12 - 46 %   Lymphs Abs 1.6 0.7 - 4.0 K/uL   Monocytes Relative 10 3 - 12 %   Monocytes Absolute 0.7 0.1 - 1.0 K/uL   Eosinophils Relative 3 0 - 5 %   Eosinophils Absolute 0.2 0.0 - 0.7 K/uL   Basophils Relative 1 0 - 1 %   Basophils Absolute 0.0 0.0 - 0.1 K/uL  Urinalysis, Routine w reflex microscopic (not at [facility])  Result Value Ref Range   Color, Urine ORANGE (A) YELLOW   APPearance CLOUDY (A) CLEAR   Specific Gravity, Urine 1.041 (H) 1.005 - 1.030   pH 5.5 5.0 - 8.0   Glucose, UA NEGATIVE NEGATIVE mg/dL   Hgb urine dipstick LARGE (A) NEGATIVE   Bilirubin Urine SMALL (A) NEGATIVE   Ketones, ur 15 (A) NEGATIVE mg/dL   Protein, ur NEGATIVE NEGATIVE mg/dL   Urobilinogen, UA 1.0 0.0 - 1.0 mg/dL   Nitrite POSITIVE (A) NEGATIVE   Leukocytes, UA SMALL (A) NEGATIVE  Urine microscopic-add on  Result Value Ref Range   WBC, UA 3-6 <3 WBC/hpf   RBC / HPF TOO NUMEROUS TO COUNT <3 RBC/hpf   Bacteria, UA MANY (A) RARE  Urine-Other MUCOUS PRESENT    Vital signs normal except for UTI, mild anemia     Imaging Review Ct Head Wo Contrast  05/31/2015   CLINICAL DATA:  Status post unwitnessed fall. Concern for head injury. Initial encounter.  EXAM: CT HEAD WITHOUT CONTRAST  TECHNIQUE: Contiguous axial images were obtained from the base of the skull through the vertex without intravenous contrast.   COMPARISON:  CT of the head performed 05/28/2015  FINDINGS: There is no evidence of acute infarction, mass lesion, or intra- or extra-axial hemorrhage on CT.  Prominence of the ventricles and sulci suggest mild cortical volume loss. Mild cerebellar atrophy is noted. Mild periventricular white matter change likely reflects small vessel ischemic microangiopathy.  The brainstem and fourth ventricle are within normal limits. The basal ganglia are unremarkable in appearance. The cerebral hemispheres demonstrate grossly normal gray-white differentiation. No mass effect or midline shift is seen.  There is no evidence of fracture; visualized osseous structures are unremarkable in appearance. The orbits are within normal limits. There is mild partial opacification of the base of the left maxillary sinus. The remaining paranasal sinuses and mastoid air cells are well-aerated. No significant soft tissue abnormalities are seen.  IMPRESSION: 1. No evidence of traumatic intracranial injury or fracture. 2. Mild cortical volume loss and scattered small vessel ischemic microangiopathy. 3. Mild partial opacification of the base of the left maxillary sinus.   Electronically Signed   By: Roanna Raider M.D.   On: 05/31/2015 02:11     EKG Interpretation None     MDM   Final diagnoses:  Fall at home, initial encounter  Urinary tract infection without hematuria, site unspecified     I personally performed the services described in this documentation, which was scribed in my presence. The recorded information has been reviewed and considered.  Larry Albe, MD, Concha Pyo, MD 05/31/15 [phone]

## 2015-05-31 NOTE — ED Notes (Signed)
Pt sleeping soundly in bed; oxygen saturation dropped to 74% while lying flat (pt is snoring with brief periods of apnea). RN raised head of bed and oxygen saturation immediately increased to 97% on room air.  Pt is still sleeping comfortably.  No acute distress noted. Safety sitter at the bedside.

## 2015-06-02 LAB — URINE CULTURE
Culture: >=100000
Special Requests: NORMAL

## 2015-06-03 ENCOUNTER — Telehealth (HOSPITAL_COMMUNITY): Payer: Self-pay | Admitting: Emergency Medicine

## 2015-06-03 NOTE — Telephone Encounter (Signed)
Post ED Visit - Positive Culture Follow-up: Successful Patient Follow-Up  Culture assessed and recommendations reviewed by:  Celedonio Miyamoto, Pharm.D., BCPS-AQ ID  Georgina Pillion, Pharm.D., BCPS  Ider, [address].D., BCPS, AAHIVP  Estella Husk, Pharm.D., BCPS, AAHIVP  Tegan Magsam, Pharm.D.  Tennis Must, Pharm.D.  Positive Urine culture   Patient discharged without antimicrobial prescription and treatment is now indicated  Organism is resistant to prescribed ED discharge antimicrobial  Patient with positive blood cultures  Changes discussed with ED provider: Burna Forts PA New antibiotic prescription:  Ceftin 500 PO BID x seven days, d/c Keflex Called/faxed to [facility]   Contacted patient, date 06/03/15, time 1652 Patient resident @ [facility] nursing home. Notified Melody @ [facility] of positive urine culture report and need for change in antibiotic treatment. Culture report with new med order faxed to [facility] [phone]   Jiles Harold 06/03/2015, 4:59 PM

## 2015-06-03 NOTE — Progress Notes (Signed)
ED Antimicrobial Stewardship Positive Culture Follow Up   Larry Daniel is an 59 y.o. male who presented to [facility] on 05/30/2015 with a chief complaint of  Chief Complaint  Patient presents with  . Fall    Recent Results (from the past 720 hour(s))  Urine culture     Status: None   Collection Time: 05/31/15 12:50 AM  Result Value Ref Range Status   Specimen Description URINE, CATHETERIZED  Final   Special Requests Normal  Final   Culture   Final    >=100,000 COLONIES/mL ESCHERICHIA COLI Performed at [facility]    Report Status 06/02/2015 FINAL  Final   Organism ID, Bacteria ESCHERICHIA COLI  Final      Susceptibility   Escherichia coli - MIC*    AMPICILLIN >=32 RESISTANT Resistant     CEFAZOLIN 32 INTERMEDIATE Intermediate     CEFTRIAXONE <=1 SENSITIVE Sensitive     CIPROFLOXACIN >=4 RESISTANT Resistant     GENTAMICIN >=16 RESISTANT Resistant     IMIPENEM <=0.25 SENSITIVE Sensitive     NITROFURANTOIN <=16 SENSITIVE Sensitive     TRIMETH/SULFA >=320 RESISTANT Resistant     AMPICILLIN/SULBACTAM >=32 RESISTANT Resistant     PIP/TAZO >=128 RESISTANT Resistant     * >=100,000 COLONIES/mL ESCHERICHIA COLI     Treated with Keflex, organism resistant to prescribed antimicrobial Ecoli is intermediate to keflex. Will change to Ceftin.   New antibiotic prescription:   Dc Keflex Ceftin  PO BID x 7 days  ED Provider: Burna Forts, PA  Ulyses Southward, PharmD Pager: [phone] Infectious Diseases Pharmacist Phone# [phone]

## 2015-06-20 ENCOUNTER — Emergency Department (HOSPITAL_COMMUNITY): Payer: Medicare Other

## 2015-06-20 ENCOUNTER — Emergency Department (HOSPITAL_COMMUNITY)
Admission: EM | Admit: 2015-06-20 | Discharge: 2015-06-20 | Disposition: A | Discharge status: Home / Self Care | Payer: Medicare Other | Attending: Emergency Medicine | Admitting: Emergency Medicine

## 2015-06-20 ENCOUNTER — Encounter (HOSPITAL_COMMUNITY): Payer: Self-pay | Admitting: *Deleted

## 2015-06-20 DIAGNOSIS — E785 Hyperlipidemia, unspecified: Secondary | ICD-10-CM | POA: Insufficient documentation

## 2015-06-20 DIAGNOSIS — Z7982 Long term (current) use of aspirin: Secondary | ICD-10-CM | POA: Insufficient documentation

## 2015-06-20 DIAGNOSIS — G309 Alzheimer's disease, unspecified: Secondary | ICD-10-CM | POA: Diagnosis not present

## 2015-06-20 DIAGNOSIS — W1839XA Other fall on same level, initial encounter: Secondary | ICD-10-CM | POA: Diagnosis not present

## 2015-06-20 DIAGNOSIS — Z79899 Other long term (current) drug therapy: Secondary | ICD-10-CM | POA: Insufficient documentation

## 2015-06-20 DIAGNOSIS — W19XXXA Unspecified fall, initial encounter: Secondary | ICD-10-CM

## 2015-06-20 DIAGNOSIS — F0280 Dementia in other diseases classified elsewhere without behavioral disturbance: Secondary | ICD-10-CM | POA: Insufficient documentation

## 2015-06-20 DIAGNOSIS — Y9389 Activity, other specified: Secondary | ICD-10-CM | POA: Diagnosis not present

## 2015-06-20 DIAGNOSIS — S0990XA Unspecified injury of head, initial encounter: Secondary | ICD-10-CM | POA: Diagnosis present

## 2015-06-20 DIAGNOSIS — Y998 Other external cause status: Secondary | ICD-10-CM | POA: Diagnosis not present

## 2015-06-20 DIAGNOSIS — Y92128 Other place in nursing home as the place of occurrence of the external cause: Secondary | ICD-10-CM | POA: Diagnosis not present

## 2015-06-20 DIAGNOSIS — I10 Essential (primary) hypertension: Secondary | ICD-10-CM | POA: Insufficient documentation

## 2015-06-20 DIAGNOSIS — S0083XA Contusion of other part of head, initial encounter: Secondary | ICD-10-CM | POA: Insufficient documentation

## 2015-06-20 DIAGNOSIS — G20 Parkinson's disease: Secondary | ICD-10-CM | POA: Diagnosis not present

## 2015-06-20 DIAGNOSIS — F329 Major depressive disorder, single episode, unspecified: Secondary | ICD-10-CM | POA: Insufficient documentation

## 2015-06-20 DIAGNOSIS — F419 Anxiety disorder, unspecified: Secondary | ICD-10-CM | POA: Diagnosis not present

## 2015-06-20 NOTE — ED Provider Notes (Signed)
CSN: [id number]     Arrival date & time 06/20/15  1521 History   First MD Initiated Contact with Patient 06/20/15 1522     Chief Complaint  Patient presents with  . Fall   59 y/o man with extensive neurological history of parkinson's disease, alzheimer's disease, neuromuscular disorder presents for unwitnessed fall at his nursing facility earlier today. He was found lying after fall, with new vs recent and incompletely healed bruising to his forehead and left knee. Of note he has a history of falls and was seen at the ED on 8/8 and 8/10 after similar episodes. Nursing staff also note that he appeared more agitated than usual today only partially improved with ativan gel.  History severely limited by patient dementia, responses are tangential and only partially intelligible.   (Consider location/radiation/quality/duration/timing/severity/associated sxs/prior Treatment) Patient is a 59 y.o. male presenting with fall. The history is provided by the EMS personnel. The history is limited by the condition of the patient and the absence of a caregiver.  Fall This is a recurrent problem. The problem occurs every several days.    Past Medical History  Diagnosis Date  . Parkinson disease   . Depression   . Alzheimer's dementia   . Anxiety   . Hyperlipidemia   . Hypertension   . Neuromuscular disorder    Past Surgical History  Procedure Laterality Date  . Cervical fusion    . Hip arthroplasty Right 01/04/2013    Procedure: ARTHROPLASTY BIPOLAR HIP;  Surgeon: Verlee Rossetti, MD;  Location: [facility];  Service: Orthopedics;  Laterality: Right;   Family History  Problem Relation Age of Onset  . Lung cancer Paternal Grandmother    Social History  Substance Use Topics  . Smoking status: Never Smoker   . Smokeless tobacco: Never Used  . Alcohol Use: No    Review of Systems  Unable to perform ROS Dementia, limited verbal responses   Allergies  Review of patient's allergies indicates no  known allergies.  Home Medications   Prior to Admission medications   Medication Sig Start Date End Date Taking? Authorizing Provider  acetaminophen (TYLENOL) 500 MG tablet Take 500 mg by mouth 4 (four) times daily.   Yes Historical Provider, MD  acetaminophen-codeine (CAPITAL/CODEINE) 120-12 MG/5ML suspension Take 7.5 mLs by mouth every 6 (six) hours as needed for pain.   Yes Historical Provider, MD  alendronate (FOSAMAX) 70 MG tablet Take 70 mg by mouth every Monday.    Yes Historical Provider, MD  aspirin 325 MG tablet Take 325 mg by mouth every morning.    Yes Historical Provider, MD  carbidopa-levodopa (SINEMET IR) 25-100 MG per tablet Take 2 tablets by mouth See admin instructions. Take 2 tabs (50-200) by mouth 30 minutes before meals and bedtime. 7am, 11am,4pm,8pm   Yes Historical Provider, MD  cholecalciferol (VITAMIN D) 1000 UNITS tablet Take 1,000 Units by mouth every morning.  11/27/14 11/27/15 Yes Historical Provider, MD  citalopram (CELEXA) 20 MG tablet Take 20 mg by mouth every morning.   Yes Historical Provider, MD  clonazePAM (KLONOPIN) 0.5 MG tablet Take 0.25 mg by mouth daily as needed for anxiety. Take half a tablet (0.25mg ) by mouth once daily as needed for anxiety.   Yes Historical Provider, MD  cyanocobalamin 500 MCG tablet Take 500 mcg by mouth daily with breakfast. (Vitamin B12)   Yes Historical Provider, MD  docusate sodium (COLACE) 100 MG capsule Take 200 mg by mouth at bedtime.   Yes Historical Provider, MD  donepezil (ARICEPT) 10 MG tablet Take 10 mg by mouth once a week.    Yes Historical Provider, MD  entacapone (COMTAN) 200 MG tablet Take 200 mg by mouth See admin instructions. Take 1 tablet by mouth 30 min before meals and at bedtime. 7am,11am,4pm,8pm   Yes Historical Provider, MD  feeding supplement, ENSURE, (ENSURE) PUDG Take 1 Container by mouth 2 (two) times daily between meals.   Yes Historical Provider, MD  Melatonin 1 MG TABS Take 1 tablet by mouth at bedtime.    Yes Historical Provider, MD  memantine (NAMENDA) 10 MG tablet Take 10 mg by mouth 2 (two) times daily.   Yes Historical Provider, MD  methocarbamol (ROBAXIN) 500 MG tablet Take 500 mg by mouth 4 (four) times daily -  before meals and at bedtime. Take 1 tablet by mouth 30 min before meals and at bedtime   Yes Historical Provider, MD  miconazole (BAZA ANTIFUNGAL) 2 % cream Apply 1 application topically 3 (three) times daily.   Yes Historical Provider, MD  Multiple Vitamin (MULTIVITAMIN WITH MINERALS) TABS tablet Take 1 tablet by mouth daily.   Yes Historical Provider, MD  PRESCRIPTION MEDICATION Apply 0.5 mg topically daily as needed (agitation). (Ativan gel) 0.5 mg/ml.   Yes Historical Provider, MD  QUEtiapine (SEROQUEL) 25 MG tablet Take 12.5 mg by mouth every 12 (twelve) hours as needed (aggressive behavior). Take half a tablet (12.5mg ) by mouth every 12 hours as needed for aggressive behavior.   Yes Historical Provider, MD  QUEtiapine (SEROQUEL) 25 MG tablet Take 75 mg by mouth at bedtime.   Yes Historical Provider, MD  cephALEXin (KEFLEX) 500 MG capsule Take 1 capsule (500 mg total) by mouth 3 (three) times daily. Patient not taking: Reported on 06/20/2015 05/31/15   [name], MD   BP 122/76 mmHg  Pulse 81  Temp(Src) 97.9 F (36.6 C)  Resp 20  Ht 6\' 2"  (1.88 m)  Wt 231 lb (104.781 kg)  BMI 29.65 kg/m2  SpO2 97%   Physical Exam  HENT:  Left frontal forehead bruise, skin intact, no deformity, no periorbital ecchymoses  Eyes: Conjunctivae are normal. Pupils are equal, round, and reactive to light.  Neck:  C-collar in place  Cardiovascular: Normal rate, regular rhythm, normal heart sounds and intact distal pulses.   Pulmonary/Chest:  Limited cooperation with exam, air movement in all fields  Abdominal: He exhibits no distension. There is no tenderness. There is no guarding.  Musculoskeletal: He exhibits no edema.  5/5 grip strength b/l, Not cooperative with other MS exam commands   Neurological:  Alert, oriented to name, otherwise limited by patient noncooperative  Skin: Skin is warm and dry.    ED Course  Procedures (including critical care time) Labs Review Labs Reviewed - No data to display  Imaging Review Dg Pelvis 1-2 Views  06/20/2015   CLINICAL DATA:  Fall  EXAM: PELVIS - 1-2 VIEW  COMPARISON:  Lumbar spine radiographs 04/08/2015 with partial imaging of the pelvis, pelvis CT 02/21/2015  FINDINGS: Right hip arthroplasty reidentified. Proximal femora are grossly unremarkable. No displaced pelvic fracture. Left iliac bone incompletely included in the field of view. Lower lumbar spine disc degenerative change. Mild stool burden.  IMPRESSION: Negative.   Electronically Signed   By: Christiana Pellant M.D.   On: 06/20/2015 18:09   Ct Head Wo Contrast  06/20/2015   CLINICAL DATA:  Fall today.  EXAM: CT HEAD WITHOUT CONTRAST  CT CERVICAL SPINE WITHOUT CONTRAST  TECHNIQUE: Multidetector CT imaging  of the head and cervical spine was performed following the standard protocol without intravenous contrast. Multiplanar CT image reconstructions of the cervical spine were also generated.  COMPARISON:  Head CT 05/31/2015 and head/C-spine CT 05/28/2015  FINDINGS: CT HEAD FINDINGS  Ventricles, cisterns and other CSF spaces are within normal. There is very minimal chronic ischemic microvascular disease no mass, mass effect, shift of midline structures or acute hemorrhage. No evidence of acute infarction. Remaining bones and soft tissues are within normal.  CT CERVICAL SPINE FINDINGS  There is mild reversal the normal cervical lordosis. There is mild spondylosis throughout the cervical spine. Anterior fusion hardware with screws is intact and unchanged from C5-C7. Intervertebral disc spacers are present at the C5-6 and C6-7 levels. There is disc space narrowing at the C7-T1 level unchanged. Prevertebral soft tissues are normal. Uncovertebral joint spurring is present. The atlantoaxial  articulation is within normal. Facet arthropathy is present. Mild neural from narrowing is present at multiple levels. Remainder the exam is unchanged.  IMPRESSION: No acute intracranial findings.  Mild chronic ischemic microvascular disease.  No acute cervical spine injury.  Mild spondylosis of the cervical spine with disc disease at the C7-T1 level unchanged. Anterior fusion hardware from C5-C7 unchanged. Mild multilevel bilateral neural foraminal narrowing.   Electronically Signed   By: Elberta Fortis M.D.   On: 06/20/2015 17:47   Ct Cervical Spine Wo Contrast  06/20/2015   CLINICAL DATA:  Fall today.  EXAM: CT HEAD WITHOUT CONTRAST  CT CERVICAL SPINE WITHOUT CONTRAST  TECHNIQUE: Multidetector CT imaging of the head and cervical spine was performed following the standard protocol without intravenous contrast. Multiplanar CT image reconstructions of the cervical spine were also generated.  COMPARISON:  Head CT 05/31/2015 and head/C-spine CT 05/28/2015  FINDINGS: CT HEAD FINDINGS  Ventricles, cisterns and other CSF spaces are within normal. There is very minimal chronic ischemic microvascular disease no mass, mass effect, shift of midline structures or acute hemorrhage. No evidence of acute infarction. Remaining bones and soft tissues are within normal.  CT CERVICAL SPINE FINDINGS  There is mild reversal the normal cervical lordosis. There is mild spondylosis throughout the cervical spine. Anterior fusion hardware with screws is intact and unchanged from C5-C7. Intervertebral disc spacers are present at the C5-6 and C6-7 levels. There is disc space narrowing at the C7-T1 level unchanged. Prevertebral soft tissues are normal. Uncovertebral joint spurring is present. The atlantoaxial articulation is within normal. Facet arthropathy is present. Mild neural from narrowing is present at multiple levels. Remainder the exam is unchanged.  IMPRESSION: No acute intracranial findings.  Mild chronic ischemic microvascular  disease.  No acute cervical spine injury.  Mild spondylosis of the cervical spine with disc disease at the C7-T1 level unchanged. Anterior fusion hardware from C5-C7 unchanged. Mild multilevel bilateral neural foraminal narrowing.   Electronically Signed   By: Elberta Fortis M.D.   On: 06/20/2015 17:47   I have personally reviewed and evaluated these images and lab results as part of my medical decision-making.   EKG Interpretation   Date/Time:  Wednesday June 20 2015 15:26:43 EDT Ventricular Rate:  86 PR Interval:  164 QRS Duration: 83 QT Interval:  379 QTC Calculation: 453 R Axis:   82 Text Interpretation:  Sinus rhythm Borderline right axis deviation  Anteroseptal infarct, age indeterminate Confirmed by Rhunette Croft, MD, Janey Genta  [id number]) on 06/20/2015 5:26:38 PM      MDM   Final diagnoses:  Fall, initial encounter    59 y/o man  with unwitnessed fall and multiple recent similar falls. Afebrile, normotensive, no acute EKG changes. Most likely this fall is related to his neurological disease as he has abnormal gait at baseline. Imaging including CT head and neck, and 2 view pelvic film were obtained to rule out fracture or intracranial hemorrhage. No acute injuries were dmeonstrated on these studies. Additional workup for agitation not strongly indicated as he is chronically agitated and he presented with no abnormal vital signs or other new physical exam findings compared to multiple recent evaluations for similar falls. Patient discharged back to nursing facility with instruction to implement fall precautions to the extent possible.  Fuller Plan, MD 06/21/15 1335  Benjiman Core, MD 06/21/15 1539

## 2015-06-20 NOTE — ED Notes (Signed)
Pt arrives to [place] from [facility] memory care unit. Pt had an unwitnessed fall. Pt also has been reported to have been agitated all day. Facility gave ativan gel with minimal effects.

## 2015-06-20 NOTE — ED Notes (Signed)
Notified [facility] for transportation back home 

## 2015-06-20 NOTE — Discharge Instructions (Signed)
Patient seen at ED for an unwitnessed fall at [facility] nursing facility. CT imaging of the head and cervical spine were obtained, as well as plain film imaging of his hips to rule out acute fracture or intracranial hemorrhage. These studies were all negative.  Given his history of numerous recent falls, completely normal vital signs, no fever, this fall is most likely from the same neurological cause as his previous falls. Please consider fall precautions as much as able to be enforced given these recurrent incidents.

## 2015-07-08 NOTE — ED Provider Notes (Signed)
CSN: [id number]     Arrival date & time 04/17/15  1148 History   First MD Initiated Contact with Patient 04/17/15 1219     Chief Complaint  Patient presents with  . Fall     (Consider location/radiation/quality/duration/timing/severity/associated sxs/prior Treatment) HPI Patient was returned to emergency department after having been discharged back to nursing home. Nursing home physician supervisor called stating the patient was aggressive to staff and suicidal and IVC paperwork was being done by the provider. Past Medical History  Diagnosis Date  . Parkinson disease   . Depression   . Alzheimer's dementia   . Anxiety   . Hyperlipidemia   . Hypertension   . Neuromuscular disorder    Past Surgical History  Procedure Laterality Date  . Cervical fusion    . Hip arthroplasty Right 01/04/2013    Procedure: ARTHROPLASTY BIPOLAR HIP;  Surgeon: Verlee Rossetti, MD;  Location: [facility];  Service: Orthopedics;  Laterality: Right;   Family History  Problem Relation Age of Onset  . Lung cancer Paternal Grandmother    Social History  Substance Use Topics  . Smoking status: Never Smoker   . Smokeless tobacco: Never Used  . Alcohol Use: No    Review of Systems Patient cannot provide due to dementia level V caveat   Allergies  Review of patient's allergies indicates no known allergies.  Home Medications   Prior to Admission medications   Medication Sig Start Date End Date Taking? Authorizing Provider  acetaminophen-codeine (CAPITAL/CODEINE) 120-12 MG/5ML suspension Take 7.5 mLs by mouth every 6 (six) hours as needed for pain.   Yes Historical Provider, MD  alendronate (FOSAMAX) 70 MG tablet Take 70 mg by mouth every Monday.    Yes Historical Provider, MD  aspirin 325 MG tablet Take 325 mg by mouth every morning.    Yes Historical Provider, MD  carbidopa-levodopa (SINEMET IR) 25-100 MG per tablet Take 2 tablets by mouth See admin instructions. Take 2 tabs (50-200) by mouth 30 minutes  before meals and bedtime. 7am, 11am,4pm,8pm   Yes Historical Provider, MD  cholecalciferol (VITAMIN D) 1000 UNITS tablet Take 1,000 Units by mouth every morning.  11/27/14 11/27/15 Yes Historical Provider, MD  citalopram (CELEXA) 20 MG tablet Take 20 mg by mouth every morning.   Yes Historical Provider, MD  clonazePAM (KLONOPIN) 0.5 MG tablet Take 0.25 mg by mouth daily as needed for anxiety. Take half a tablet (0.25mg ) by mouth once daily as needed for anxiety.   Yes Historical Provider, MD  cyanocobalamin 500 MCG tablet Take 500 mcg by mouth daily with breakfast. (Vitamin B12)   Yes Historical Provider, MD  docusate sodium (COLACE) 100 MG capsule Take 200 mg by mouth at bedtime.   Yes Historical Provider, MD  donepezil (ARICEPT) 10 MG tablet Take 10 mg by mouth once a week.    Yes Historical Provider, MD  entacapone (COMTAN) 200 MG tablet Take 200 mg by mouth See admin instructions. Take 1 tablet by mouth 30 min before meals and at bedtime. 7am,11am,4pm,8pm   Yes Historical Provider, MD  Melatonin 1 MG TABS Take 1 tablet by mouth at bedtime.   Yes Historical Provider, MD  methocarbamol (ROBAXIN) 500 MG tablet Take 500 mg by mouth 4 (four) times daily -  before meals and at bedtime. Take 1 tablet by mouth 30 min before meals and at bedtime   Yes Historical Provider, MD  Multiple Vitamin (MULTIVITAMIN WITH MINERALS) TABS tablet Take 1 tablet by mouth daily.   Yes  Historical Provider, MD  PRESCRIPTION MEDICATION Apply 0.5 mg topically daily as needed (agitation). (Ativan gel) 0.5 mg/ml.   Yes Historical Provider, MD  QUEtiapine (SEROQUEL) 25 MG tablet Take 12.5 mg by mouth every 12 (twelve) hours as needed (aggressive behavior). Take half a tablet (12.5mg ) by mouth every 12 hours as needed for aggressive behavior.   Yes Historical Provider, MD  QUEtiapine (SEROQUEL) 25 MG tablet Take 75 mg by mouth at bedtime.   Yes Historical Provider, MD  acetaminophen (TYLENOL) 500 MG tablet Take 500 mg by mouth 4 (four)  times daily.    Historical Provider, MD  cephALEXin (KEFLEX) 500 MG capsule Take 1 capsule (500 mg total) by mouth 3 (three) times daily. Patient not taking: Reported on 06/20/2015 05/31/15   [name], MD  feeding supplement, ENSURE, (ENSURE) PUDG Take 1 Container by mouth 2 (two) times daily between meals.    Historical Provider, MD  memantine (NAMENDA) 10 MG tablet Take 10 mg by mouth 2 (two) times daily.    Historical Provider, MD  miconazole (BAZA ANTIFUNGAL) 2 % cream Apply 1 application topically 3 (three) times daily.    Historical Provider, MD   BP 128/76 mmHg  Pulse 77  Temp(Src) 97.6 F (36.4 C) (Axillary)  Resp 19  SpO2 100% Physical Exam  Constitutional:  No respiratory distress  HENT:  Head: Normocephalic and atraumatic.  Eyes: EOM are normal.  Pulmonary/Chest: Effort normal.  Neurological: He is alert.    ED Course  Procedures (including critical care time) Labs Review Labs Reviewed  CBC WITH DIFFERENTIAL/PLATELET - Abnormal; Notable for the following:    RBC 3.99 (*)    Hemoglobin 12.3 (*)    HCT 35.8 (*)    Neutrophils Relative % 82 (*)    Lymphocytes Relative 10 (*)    All other components within normal limits  BASIC METABOLIC PANEL - Abnormal; Notable for the following:    Potassium 3.2 (*)    Glucose, Bld 108 (*)    Calcium 8.8 (*)    All other components within normal limits  URINALYSIS, ROUTINE W REFLEX MICROSCOPIC (NOT AT [facility]) - Abnormal; Notable for the following:    Color, Urine ORANGE (*)    APPearance CLOUDY (*)    Hgb urine dipstick SMALL (*)    Bilirubin Urine SMALL (*)    Ketones, ur 40 (*)    Nitrite POSITIVE (*)    Leukocytes, UA MODERATE (*)    All other components within normal limits  URINE MICROSCOPIC-ADD ON - Abnormal; Notable for the following:    Bacteria, UA MANY (*)    All other components within normal limits    Imaging Review No results found. I have personally reviewed and evaluated these images and lab results as part  of my medical decision-making.   EKG Interpretation None      MDM   Dementia, behavior disorder, IVC.  Patient sent back from nursing home after initial emergency department treatment for medical condition. He is now returned for dementia and psychiatric disorder evaluation and treatment    Arby Barrette, MD 07/08/15 [phone]

## 2015-09-11 ENCOUNTER — Emergency Department (HOSPITAL_COMMUNITY): Payer: Medicare Other

## 2015-09-11 ENCOUNTER — Emergency Department (HOSPITAL_COMMUNITY)
Admission: EM | Admit: 2015-09-11 | Discharge: 2015-09-11 | Disposition: A | Discharge status: Home / Self Care | Payer: Medicare Other | Attending: Emergency Medicine | Admitting: Emergency Medicine

## 2015-09-11 ENCOUNTER — Encounter (HOSPITAL_COMMUNITY): Payer: Self-pay | Admitting: Emergency Medicine

## 2015-09-11 DIAGNOSIS — F419 Anxiety disorder, unspecified: Secondary | ICD-10-CM | POA: Insufficient documentation

## 2015-09-11 DIAGNOSIS — I10 Essential (primary) hypertension: Secondary | ICD-10-CM | POA: Diagnosis not present

## 2015-09-11 DIAGNOSIS — F329 Major depressive disorder, single episode, unspecified: Secondary | ICD-10-CM | POA: Diagnosis not present

## 2015-09-11 DIAGNOSIS — Z8639 Personal history of other endocrine, nutritional and metabolic disease: Secondary | ICD-10-CM | POA: Diagnosis not present

## 2015-09-11 DIAGNOSIS — F0390 Unspecified dementia without behavioral disturbance: Secondary | ICD-10-CM | POA: Diagnosis not present

## 2015-09-11 DIAGNOSIS — G20 Parkinson's disease: Secondary | ICD-10-CM | POA: Insufficient documentation

## 2015-09-11 DIAGNOSIS — Z7982 Long term (current) use of aspirin: Secondary | ICD-10-CM | POA: Insufficient documentation

## 2015-09-11 DIAGNOSIS — Z79899 Other long term (current) drug therapy: Secondary | ICD-10-CM | POA: Insufficient documentation

## 2015-09-11 DIAGNOSIS — Z792 Long term (current) use of antibiotics: Secondary | ICD-10-CM | POA: Diagnosis not present

## 2015-09-11 DIAGNOSIS — Z7952 Long term (current) use of systemic steroids: Secondary | ICD-10-CM | POA: Diagnosis not present

## 2015-09-11 DIAGNOSIS — R4182 Altered mental status, unspecified: Secondary | ICD-10-CM | POA: Diagnosis present

## 2015-09-11 DIAGNOSIS — R404 Transient alteration of awareness: Secondary | ICD-10-CM | POA: Diagnosis not present

## 2015-09-11 LAB — CBC WITH DIFFERENTIAL/PLATELET
Basophils Absolute: 0 10*3/uL (ref 0.0–0.1)
Basophils Relative: 0 %
EOS PCT: 2 %
Eosinophils Absolute: 0.1 10*3/uL (ref 0.0–0.7)
HCT: 39.3 % (ref 39.0–52.0)
HEMOGLOBIN: 13.1 g/dL (ref 13.0–17.0)
LYMPHS ABS: 1.1 10*3/uL (ref 0.7–4.0)
LYMPHS PCT: 20 %
MCH: 30.9 pg (ref 26.0–34.0)
MCHC: 33.3 g/dL (ref 30.0–36.0)
MCV: 92.7 fL (ref 78.0–100.0)
MONOS PCT: 7 %
Monocytes Absolute: 0.3 10*3/uL (ref 0.1–1.0)
NEUTROS PCT: 71 %
Neutro Abs: 3.6 10*3/uL (ref 1.7–7.7)
Platelets: 167 10*3/uL (ref 150–400)
RBC: 4.24 MIL/uL (ref 4.22–5.81)
RDW: 13 % (ref 11.5–15.5)
WBC: 5.2 10*3/uL (ref 4.0–10.5)

## 2015-09-11 LAB — BASIC METABOLIC PANEL
Anion gap: 8 (ref 5–15)
BUN: 26 mg/dL — AB (ref 6–20)
CHLORIDE: 104 mmol/L (ref 101–111)
CO2: 30 mmol/L (ref 22–32)
CREATININE: 0.73 mg/dL (ref 0.61–1.24)
Calcium: 9.3 mg/dL (ref 8.9–10.3)
GFR calc Af Amer: >60 mL/min (ref >60)
GFR calc non Af Amer: >60 mL/min (ref >60)
GLUCOSE: 90 mg/dL (ref 65–99)
POTASSIUM: 4.7 mmol/L (ref 3.5–5.1)
Sodium: 142 mmol/L (ref 135–145)

## 2015-09-11 LAB — I-STAT TROPONIN, ED: Troponin i, poc: 0 ng/mL (ref 0.00–0.08)

## 2015-09-11 MED ORDER — SODIUM CHLORIDE 0.9 % IV BOLUS (SEPSIS)
1000.0000 mL | Freq: Once | INTRAVENOUS | Status: AC
  Administered 2015-09-11: 1000 mL via INTRAVENOUS

## 2015-09-11 NOTE — ED Provider Notes (Signed)
Patient was accepted in sign out pending UA results.  Patient demented and felt that he was being accosted when straight catheterization attempted.  History of episode of change in color and responsiveness reviewed.  Patient remained stable throughout hours of observation in ER and had an unremarkable EKG and troponin.  UA felt likely to not explain acute, resolved episode.  As per plan with physician who saw patient primarily patient was discharged back to his nursing facility.  Leta BaptistEmily Roe Aviva Wolfer, MD 09/15/15 [phone]

## 2015-09-11 NOTE — ED Provider Notes (Signed)
CSN: [id number]     Arrival date & time 09/11/15  1253 History   First MD Initiated Contact with Patient 09/11/15 1310     Chief Complaint  Patient presents with  . Cyanosis     (Consider location/radiation/quality/duration/timing/severity/associated sxs/prior Treatment) Patient is a 59 y.o. male presenting with general illness. The history is provided by the patient.  Illness Severity:  Severe Onset quality:  Sudden Duration:  1 hour Timing:  Rare Progression:  Resolved Chronicity:  New Associated symptoms: no abdominal pain, no chest pain, no congestion, no diarrhea, no fever, no headaches, no myalgias, no rash, no shortness of breath and no vomiting     59 yo M with a chief complaints of altered mental status. This lasted for about 10 minutes and as per the nursing home staff. There they had put on a pulse oximetry noted that he was in the 70s. Placed him on oxygen with improvement. When EMS arrived he is satting 99% on room air. Was at his current baseline. Patient states that he does not remember the episode. Denies any cough congestion fevers. Denies any head injuries. Denies any areas of pain.  Past Medical History  Diagnosis Date  . Parkinson disease (HCC)   . Depression   . Alzheimer's dementia   . Anxiety   . Hyperlipidemia   . Hypertension   . Neuromuscular disorder [facility])    Past Surgical History  Procedure Laterality Date  . Cervical fusion    . Hip arthroplasty Right 01/04/2013    Procedure: ARTHROPLASTY BIPOLAR HIP;  Surgeon: Verlee Rossetti, MD;  Location: [facility];  Service: Orthopedics;  Laterality: Right;   Family History  Problem Relation Age of Onset  . Lung cancer Paternal Grandmother    Social History  Substance Use Topics  . Smoking status: Never Smoker   . Smokeless tobacco: Never Used  . Alcohol Use: No    Review of Systems  Constitutional: Negative for fever and chills.  HENT: Negative for congestion and facial swelling.   Eyes: Negative  for discharge and visual disturbance.  Respiratory: Negative for shortness of breath.   Cardiovascular: Negative for chest pain and palpitations.  Gastrointestinal: Negative for vomiting, abdominal pain and diarrhea.  Musculoskeletal: Negative for myalgias and arthralgias.  Skin: Negative for color change and rash.  Neurological: Negative for tremors, syncope and headaches.  Psychiatric/Behavioral: Negative for confusion and dysphoric mood.      Allergies  Review of patient's allergies indicates no known allergies.  Home Medications   Prior to Admission medications   Medication Sig Start Date End Date Taking? Authorizing Provider  acetaminophen (TYLENOL) 325 MG tablet Take 650 mg by mouth 4 (four) times daily.   Yes Historical Provider, MD  alendronate (FOSAMAX) 70 MG tablet Take 70 mg by mouth every Monday.    Yes Historical Provider, MD  aspirin 325 MG tablet Take 325 mg by mouth every morning.    Yes Historical Provider, MD  bisacodyl (DULCOLAX) 10 MG suppository Place 10 mg rectally as needed for moderate constipation.   Yes Historical Provider, MD  carbidopa-levodopa (SINEMET IR) 25-100 MG per tablet Take 2 tablets by mouth See admin instructions. Take 2 tabs (50-200) by mouth 30 minutes before meals and bedtime. 7am, 11am,4pm,8pm   Yes Historical Provider, MD  cholecalciferol (VITAMIN D) 1000 UNITS tablet Take 1,000 Units by mouth every morning.  11/27/14 11/27/15 Yes Historical Provider, MD  citalopram (CELEXA) 40 MG tablet Take 40 mg by mouth daily.   Yes  Historical Provider, MD  clonazePAM (KLONOPIN) 0.5 MG tablet Take 0.25 mg by mouth daily as needed for anxiety. Take half a tablet (0.25mg ) by mouth once daily as needed for anxiety.   Yes Historical Provider, MD  cyanocobalamin 500 MCG tablet Take 500 mcg by mouth daily with breakfast. (Vitamin B12)   Yes Historical Provider, MD  divalproex (DEPAKOTE SPRINKLE) 125 MG capsule Take 250 mg by mouth 2 (two) times daily.   Yes Historical  Provider, MD  docusate sodium (COLACE) 100 MG capsule Take 200 mg by mouth at bedtime.   Yes Historical Provider, MD  donepezil (ARICEPT) 10 MG tablet Take 10 mg by mouth daily.    Yes Historical Provider, MD  ENSURE (ENSURE) Take 1 Can by mouth 3 (three) times daily between meals.   Yes Historical Provider, MD  folic acid (FOLVITE) 1 MG tablet Take 1 mg by mouth daily.   Yes Historical Provider, MD  lisinopril (PRINIVIL,ZESTRIL) 2.5 MG tablet Take 2.5 mg by mouth daily.   Yes Historical Provider, MD  LORazepam (ATIVAN) 0.5 MG tablet Take 0.5 mg by mouth daily.   Yes Historical Provider, MD  LORazepam (ATIVAN) 1 MG tablet Take 1 mg by mouth every 6 (six) hours as needed for anxiety.   Yes Historical Provider, MD  Melatonin 1 MG TABS Take 1 tablet by mouth at bedtime.   Yes Historical Provider, MD  memantine (NAMENDA) 10 MG tablet Take 5 mg by mouth at bedtime.    Yes Historical Provider, MD  methocarbamol (ROBAXIN) 500 MG tablet Take 500 mg by mouth 4 (four) times daily -  before meals and at bedtime. Take 1 tablet by mouth 30 min before meals and at bedtime   Yes Historical Provider, MD  Multiple Vitamin (MULTIVITAMIN WITH MINERALS) TABS tablet Take 1 tablet by mouth daily.   Yes Historical Provider, MD  QUEtiapine (SEROQUEL) 25 MG tablet Take 25 mg by mouth every 12 (twelve) hours as needed (aggressive behavior).   Yes Historical Provider, MD  QUEtiapine (SEROQUEL) 50 MG tablet Take 50 mg by mouth at bedtime.   Yes Historical Provider, MD  tobramycin-dexamethasone [facility]) ophthalmic solution Place 2 drops into both eyes 3 (three) times daily. For 7 days 09/11/15 09/17/15 Yes Historical Provider, MD  acetaminophen (TYLENOL) 500 MG tablet Take 500 mg by mouth 4 (four) times daily.    Historical Provider, MD  acetaminophen-codeine (CAPITAL/CODEINE) 120-12 MG/5ML suspension Take 7.5 mLs by mouth every 6 (six) hours as needed for pain.    Historical Provider, MD  cephALEXin (KEFLEX) 500 MG capsule  Take 1 capsule (500 mg total) by mouth 3 (three) times daily. Patient not taking: Reported on 06/20/2015 05/31/15   [name], MD   BP 124/86 mmHg  Pulse 90  Temp(Src) 96.9 F (36.1 C) (Axillary)  Resp 13  Ht  (1.905 m)  Wt 230 lb (104.327 kg)  BMI 28.75 kg/m2  SpO2 96% Physical Exam  Constitutional: He is oriented to person, place, and time. He appears cachectic.  HENT:  Head: Normocephalic and atraumatic.  Eyes: EOM are normal. Pupils are equal, round, and reactive to light.  Neck: Normal range of motion. Neck supple. No JVD present.  Cardiovascular: Normal rate and regular rhythm.  Exam reveals no gallop and no friction rub.   No murmur heard. Pulmonary/Chest: No respiratory distress. He has no wheezes.  Abdominal: He exhibits no distension. There is no rebound and no guarding.  Musculoskeletal: Normal range of motion.  Neurological: He is alert  and oriented to person, place, and time.  Skin: No rash noted. No pallor.  Psychiatric: He has a normal mood and affect. His behavior is normal.  Nursing note and vitals reviewed.   ED Course  Procedures (including critical care time) Labs Review Labs Reviewed  BASIC METABOLIC PANEL - Abnormal; Notable for the following:    BUN 26 (*)    All other components within normal limits  CBC WITH DIFFERENTIAL/PLATELET  URINALYSIS, ROUTINE W REFLEX MICROSCOPIC (NOT AT [facility])    Imaging Review Dg Chest 2 View  09/11/2015  CLINICAL DATA:  Low O2 sats, cyanosis EXAM: CHEST - 2 VIEW COMPARISON:  04/08/2015 FINDINGS: The lung apices are excluded. Visualized lung fields are clear, mildly hyperinflated. Heart size normal. Mediastinal contour are unremarkable. No pneumothorax evident. No effusion. Mild thoracic dextroscoliosis with bridging osteophytes across multiple contiguous levels in the mid and lower thoracic spine. Degenerative changes in the left shoulder as before. IMPRESSION: 1. Chronic osseous changes as above. No acute  cardiopulmonary disease. Electronically Signed   By: Corlis Leak  Hassell M.D.   On: 09/11/2015 14:15   Ct Head Wo Contrast  09/11/2015  CLINICAL DATA:  Patient with altered mental status. Cyanotic. Low oxygen saturation. EXAM: CT HEAD WITHOUT CONTRAST TECHNIQUE: Contiguous axial images were obtained from the base of the skull through the vertex without intravenous contrast. COMPARISON:  Brain CT 06/20/2015. FINDINGS: Ventricles and sulci are appropriate for patient's age. No evidence for acute cortically based infarct, intracranial hemorrhage, mass lesion or mass-effect. Mild periventricular and subcortical white matter hypodensity. Orbits are unremarkable. Paranasal sinuses are well aerated. Mastoid air cells are unremarkable. Calvarium is intact. IMPRESSION: No acute intracranial process. Mild chronic microvascular ischemic change. Electronically Signed   By: Annia Beltrew  Davis M.D.   On: 09/11/2015 14:40   I have personally reviewed and evaluated these images and lab results as part of my medical decision-making.   EKG Interpretation None      MDM   Final diagnoses:  Transient alteration of awareness    59 yo M with a chief complaint of altered mental status. At his baseline on arrival here. Patient was observed in the ED for about 3 hours with no significant change. Chest x-ray negative CBC BMP troponin CT head unremarkable. Awaiting a UA. If negative see no need for hospitalization at this time.  Care turned over to Dr. Cyndie ChimeNguyen.   The patients results and plan were reviewed and discussed.   Any x-rays performed were independently reviewed by myself.   Differential diagnosis were considered with the presenting HPI.  Medications  sodium chloride 0.9 % bolus 1,000 mL (1,000 mLs Intravenous New Bag/Given 09/11/15 1551)    Filed Vitals:   09/11/15 1345 09/11/15 1430 09/11/15 1431 09/11/15 1515  BP: 121/91 110/74  124/86  Pulse: 81 67  90  Temp:      TempSrc:      Resp: 19  13   Height:       Weight:      SpO2: 98% 97%  96%    Final diagnoses:  None        Melene Planan Shakeisha Horine, DO 09/12/15 [id number]

## 2015-09-11 NOTE — ED Notes (Signed)
Pt unable to void at this time. 

## 2015-09-11 NOTE — ED Notes (Signed)
This RN with the assistance of another RN attempted to I&O cath pt after attempting to explaining procedure to pt.  Pt became violent and verbally abusive, scratching and hitting both RN's.  Pt unwilling to let this RN attempt to place condom cath.  MD made aware.

## 2015-09-11 NOTE — ED Notes (Signed)
[facility] via [facility].  EMS reports staff reports episode of O2 sats in the 70's% for 10 minutes with pt cyanotic.  Per EMS pt had been placed on 4L O2 prior to arrival, upon arrival pt 99% RA.  Per staff pt at baseline mental function. Hx dementia.  Pt oriented to self, resp e/u.

## 2015-09-11 NOTE — ED Notes (Signed)
Pt ready for discharge, awaiting [facility] transport back to SNF.

## 2015-09-11 NOTE — ED Notes (Signed)
Pt back from CT

## 2015-09-11 NOTE — ED Notes (Signed)
Patient transported to X-ray 

## 2015-09-11 NOTE — ED Notes (Signed)
[facility] called  

## 2015-09-11 NOTE — ED Notes (Signed)
Pt left with all his belongings and was transported by [facility] back to SNF.

## 2015-09-11 NOTE — Discharge Instructions (Signed)
The exact cause of your incident of shortness of breath/change in mental status is unknown but your blood work, EKG, xrays, head CT did not show anything acute.  Follow up with your primary care provider.  Shortness of Breath Shortness of breath means you have trouble breathing. It could also mean that you have a medical problem. You should get immediate medical care for shortness of breath. CAUSES   Not enough oxygen in the air such as with high altitudes or a smoke-filled room.  Certain lung diseases, infections, or problems.  Heart disease or conditions, such as angina or heart failure.  Low red blood cells (anemia).  Poor physical fitness, which can cause shortness of breath when you exercise.  Chest or back injuries or stiffness.  Being overweight.  Smoking.  Anxiety, which can make you feel like you are not getting enough air. DIAGNOSIS  Serious medical problems can often be found during your physical exam. Tests may also be done to determine why you are having shortness of breath. Tests may include:  Chest X-rays.  Lung function tests.  Blood tests.  An electrocardiogram (ECG).  An ambulatory electrocardiogram. An ambulatory ECG records your heartbeat patterns over a 24-hour period.  Exercise testing.  A transthoracic echocardiogram (TTE). During echocardiography, sound waves are used to evaluate how blood flows through your heart.  A transesophageal echocardiogram (TEE).  Imaging scans. Your health care provider may not be able to find a cause for your shortness of breath after your exam. In this case, it is important to have a follow-up exam with your health care provider as directed.  TREATMENT  Treatment for shortness of breath depends on the cause of your symptoms and can vary greatly. HOME CARE INSTRUCTIONS   Do not smoke. Smoking is a common cause of shortness of breath. If you smoke, ask for help to quit.  Avoid being around chemicals or things that  may bother your breathing, such as paint fumes and dust.  Rest as needed. Slowly resume your usual activities.  If medicines were prescribed, take them as directed for the full length of time directed. This includes oxygen and any inhaled medicines.  Keep all follow-up appointments as directed by your health care provider. SEEK MEDICAL CARE IF:   Your condition does not improve in the time expected.  You have a hard time doing your normal activities even with rest.  You have any new symptoms. SEEK IMMEDIATE MEDICAL CARE IF:   Your shortness of breath gets worse.  You feel light-headed, faint, or develop a cough not controlled with medicines.  You start coughing up blood.  You have pain with breathing.  You have chest pain or pain in your arms, shoulders, or abdomen.  You have a fever.  You are unable to walk up stairs or exercise the way you normally do. MAKE SURE YOU:  Understand these instructions.  Will watch your condition.  Will get help right away if you are not doing well or get worse.   This information is not intended to replace advice given to you by your health care provider. Make sure you discuss any questions you have with your health care provider.   Document Released: 07/01/2001 Document Revised: 10/11/2013 Document Reviewed: 12/22/2011 Elsevier Interactive Patient Education [employer]

## 2016-01-19 DEATH — deceased

## 2016-07-07 IMAGING — CR DG CHEST 2V
2 series · 2 of 2 positions shown · non-contrast
Comparison: August 23, 2012

CLINICAL DATA: Altered mental status

EXAM:
CHEST  2 VIEW

[w chest lat]
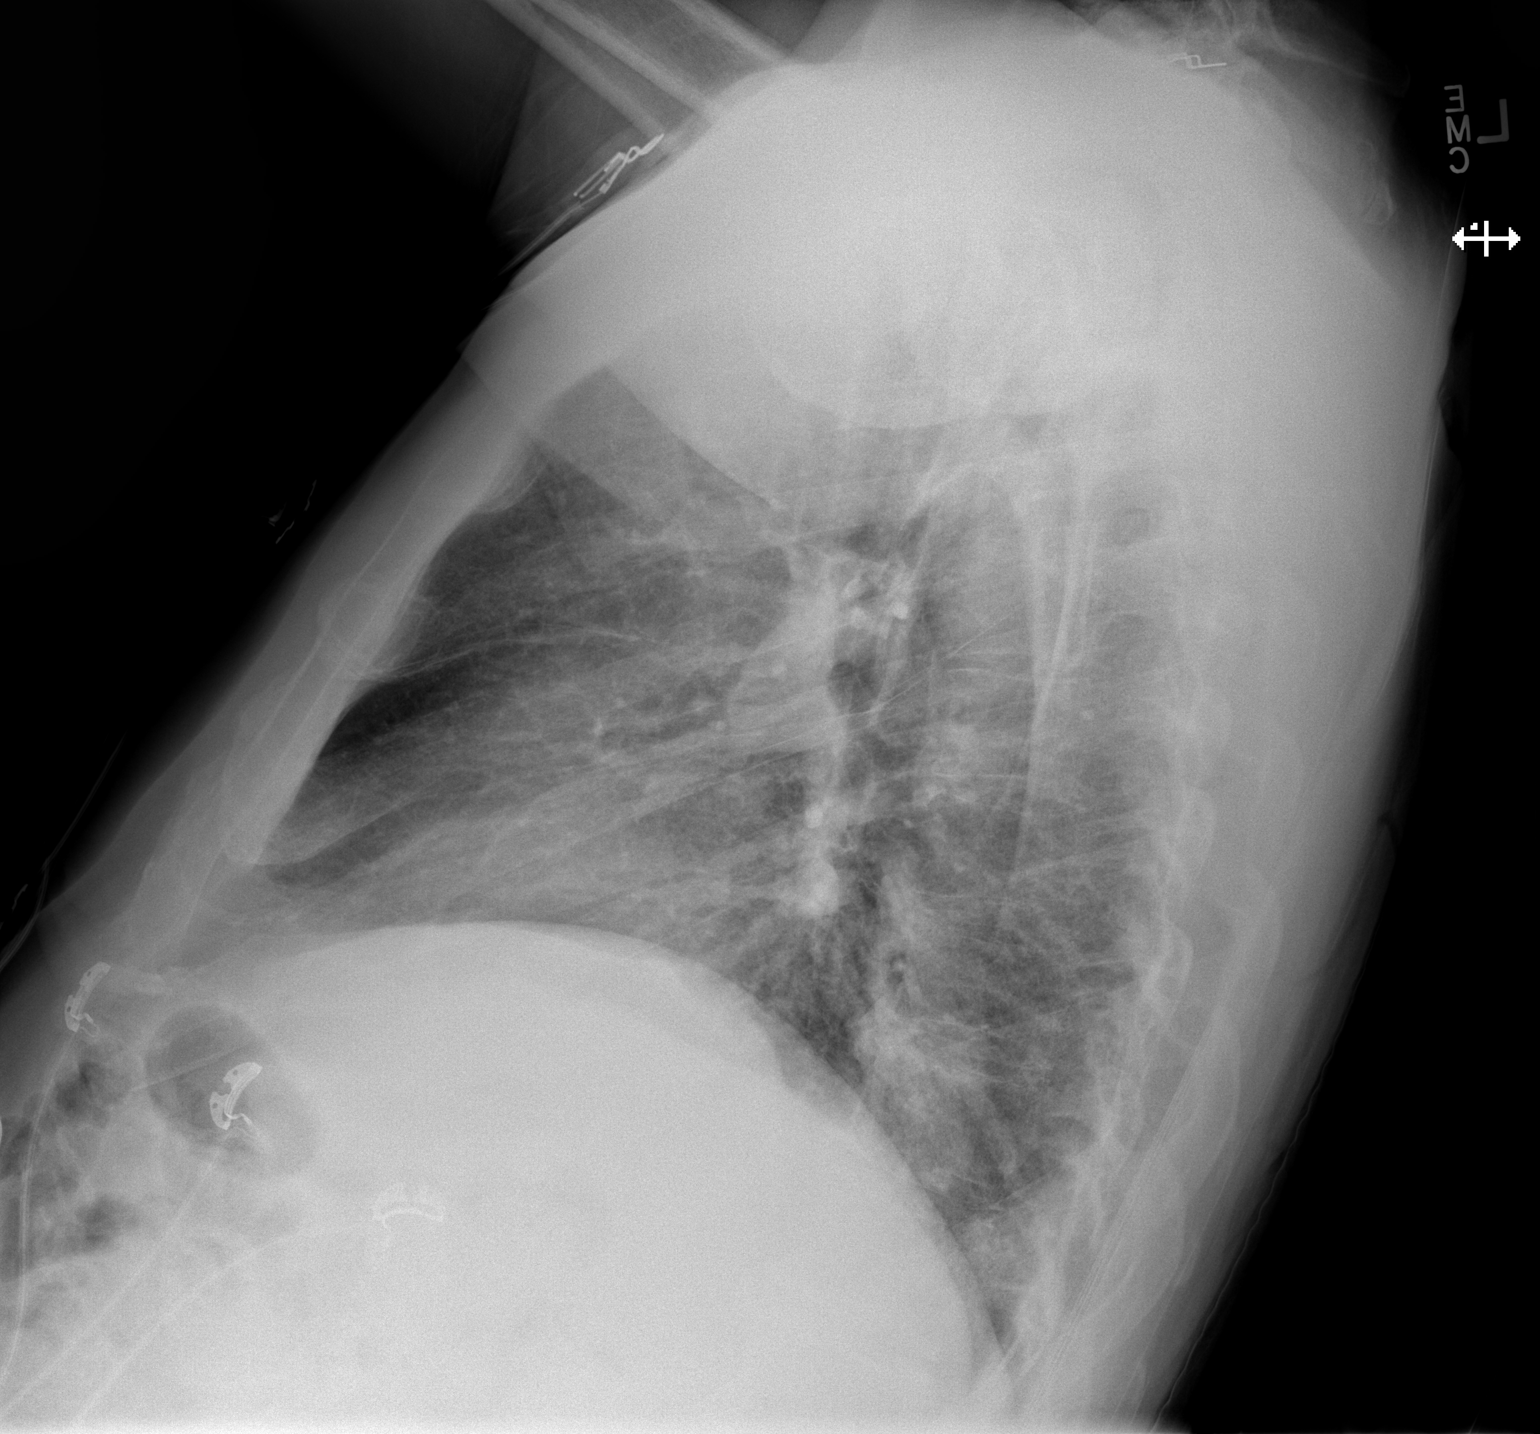

[x chest ap]
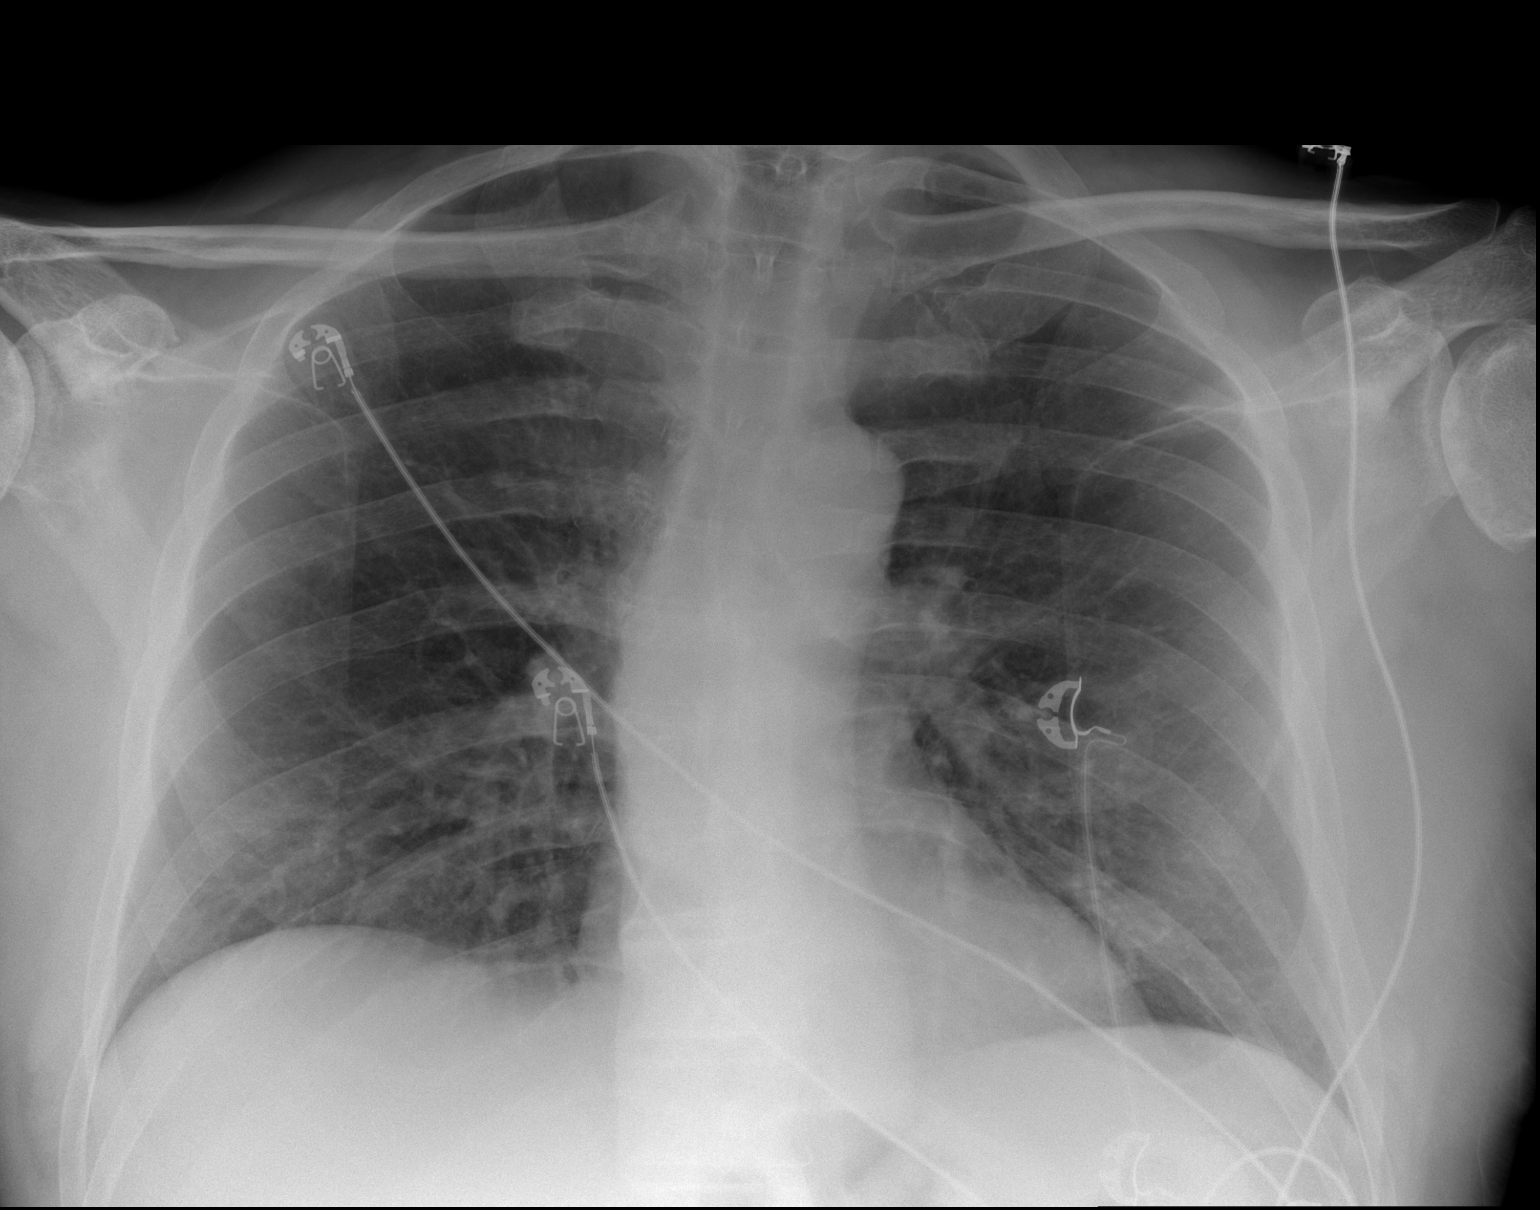

[2 of 2 positions shown; findings below may reference images not displayed]

FINDINGS: Lungs are clear. Heart size and pulmonary vascularity are normal. No
adenopathy. There is postoperative change in the lower cervical
spine. There is upper thoracic levoscoliosis.
IMPRESSION: No edema or consolidation.

## 2017-01-16 IMAGING — CR DG CHEST 1V PORT
2 series · 2 of 2 positions shown · non-contrast
Comparison: August 21, 2014

CLINICAL DATA: Chest pain for several days

EXAM:
PORTABLE CHEST - 1 VIEW

[AP (1 of 2)]
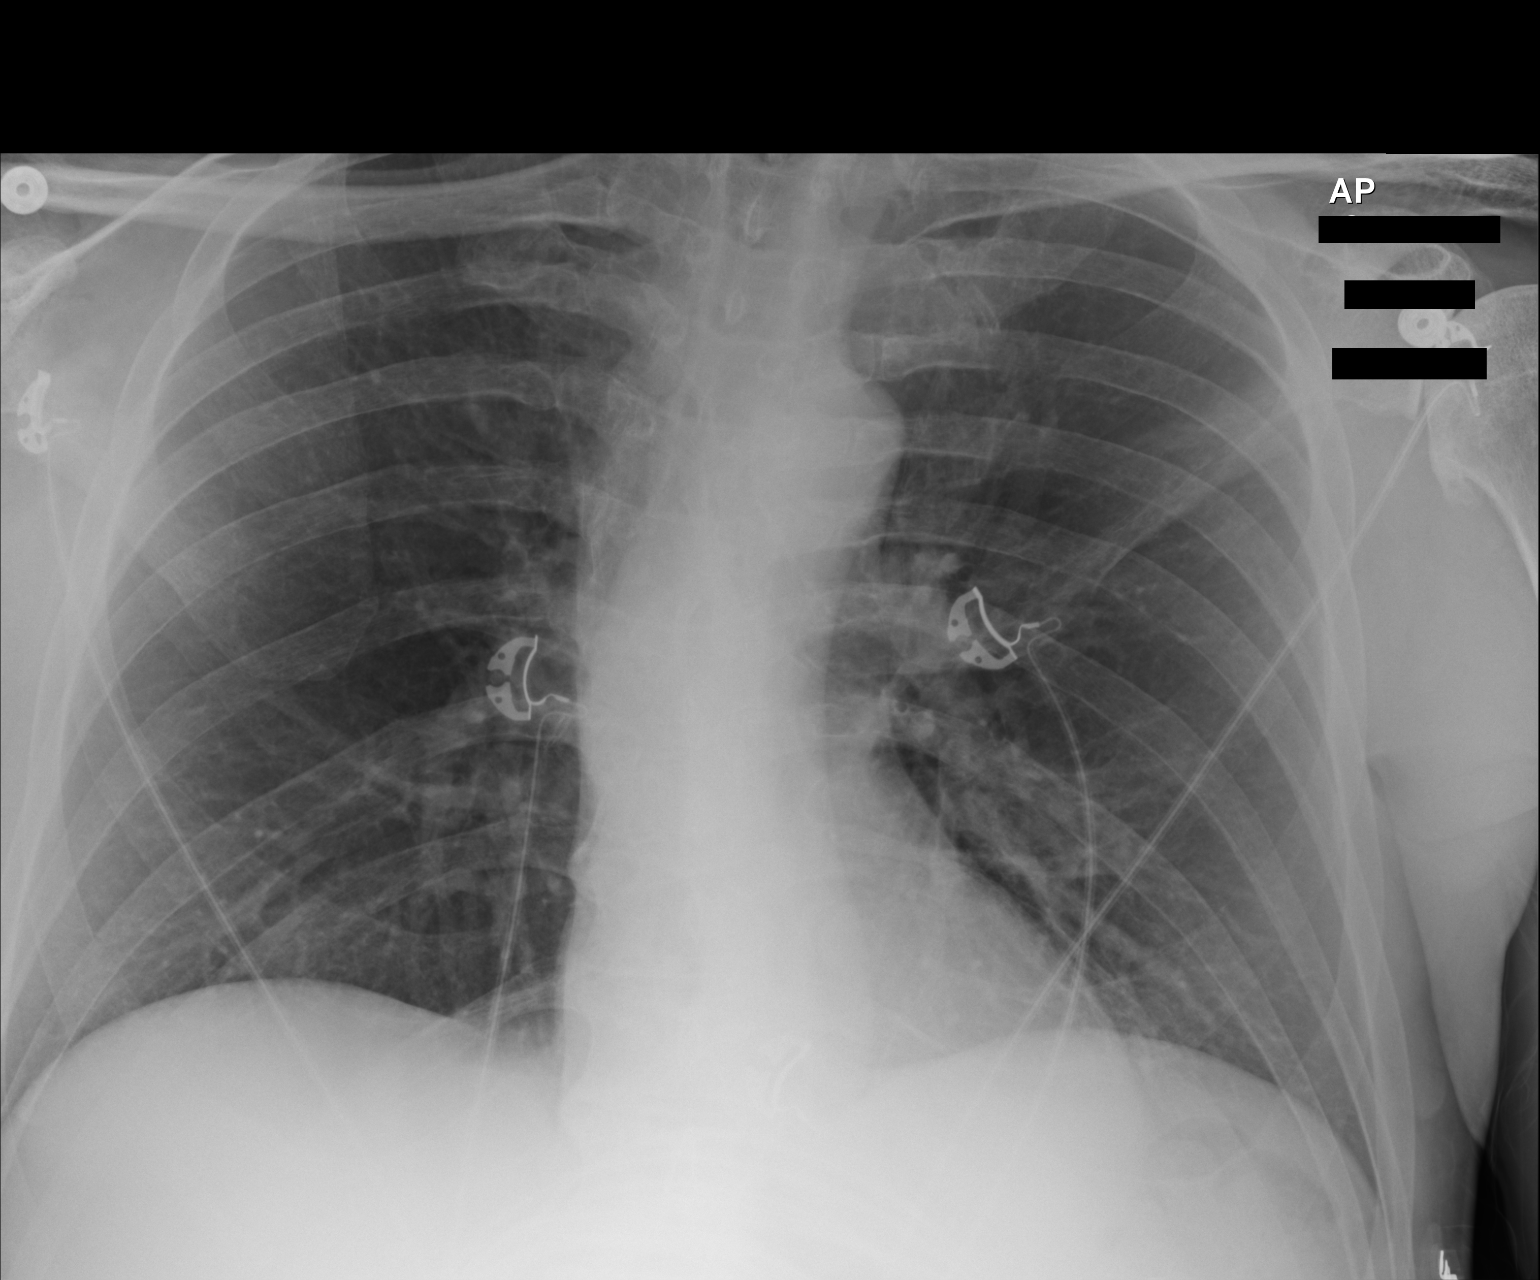

[AP (2 of 2)]
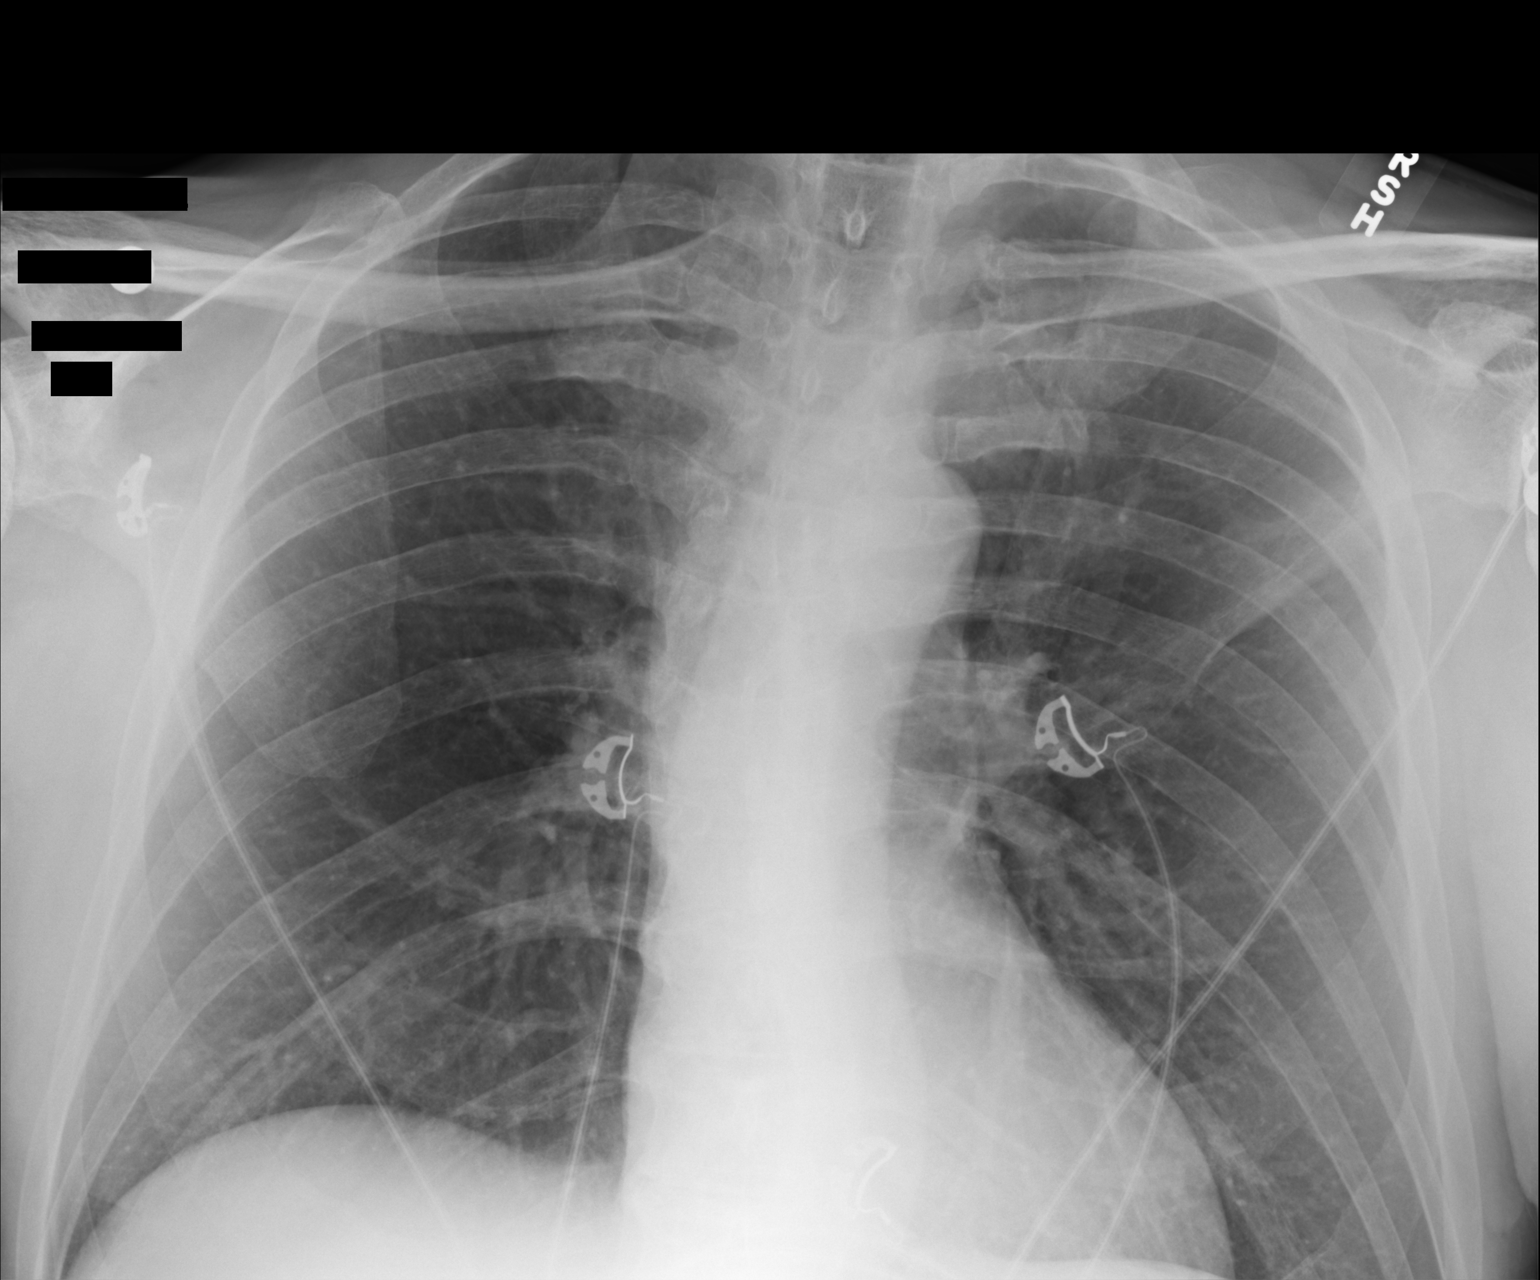

[2 of 2 positions shown; findings below may reference images not displayed]

FINDINGS: There is slight upper lobe atelectatic change. There is no edema or
consolidation. Heart size and pulmonary vascularity are normal. No
adenopathy.
IMPRESSION: Mild left upper lobe atelectasis.  No edema or consolidation.
